# Patient Record
Sex: Male | Born: 1955 | Race: White | Hispanic: No | Marital: Married | Smoking: Never smoker
Health system: Southern US, Community
[De-identification: ages and names within clinical notes are randomized; demographics above are authoritative.]

## PROBLEM LIST (undated history)

## (undated) DIAGNOSIS — Z87442 Personal history of urinary calculi: Secondary | ICD-10-CM

## (undated) DIAGNOSIS — E663 Overweight: Secondary | ICD-10-CM

## (undated) DIAGNOSIS — E291 Testicular hypofunction: Secondary | ICD-10-CM

## (undated) DIAGNOSIS — C4491 Basal cell carcinoma of skin, unspecified: Secondary | ICD-10-CM

## (undated) DIAGNOSIS — N400 Enlarged prostate without lower urinary tract symptoms: Secondary | ICD-10-CM

## (undated) DIAGNOSIS — N529 Male erectile dysfunction, unspecified: Secondary | ICD-10-CM

## (undated) DIAGNOSIS — R361 Hematospermia: Secondary | ICD-10-CM

## (undated) DIAGNOSIS — I10 Essential (primary) hypertension: Secondary | ICD-10-CM

## (undated) DIAGNOSIS — M1990 Unspecified osteoarthritis, unspecified site: Secondary | ICD-10-CM

## (undated) HISTORY — DX: Essential (primary) hypertension: I10

## (undated) HISTORY — DX: Male erectile dysfunction, unspecified: N52.9

## (undated) HISTORY — DX: Hematospermia: R36.1

## (undated) HISTORY — DX: Benign prostatic hyperplasia without lower urinary tract symptoms: N40.0

## (undated) HISTORY — PX: VARICOCELE EXCISION: SUR582

## (undated) HISTORY — PX: TONSILLECTOMY: SUR1361

## (undated) HISTORY — DX: Testicular hypofunction: E29.1

## (undated) HISTORY — DX: Overweight: E66.3

---

## 2006-05-06 ENCOUNTER — Encounter: Admission: RE | Admit: 2006-05-06 | Discharge: 2006-05-06 | Payer: Self-pay | Admitting: Orthopedic Surgery

## 2006-05-08 ENCOUNTER — Encounter: Admission: RE | Admit: 2006-05-08 | Discharge: 2006-05-08 | Payer: Self-pay | Admitting: Orthopedic Surgery

## 2006-06-11 ENCOUNTER — Encounter: Payer: Self-pay | Admitting: Orthopedic Surgery

## 2006-06-24 ENCOUNTER — Encounter: Payer: Self-pay | Admitting: Orthopedic Surgery

## 2006-07-25 ENCOUNTER — Encounter: Payer: Self-pay | Admitting: Orthopedic Surgery

## 2007-09-25 HISTORY — PX: SHOULDER SURGERY: SHX246

## 2011-08-30 ENCOUNTER — Inpatient Hospital Stay: Payer: Self-pay | Admitting: Internal Medicine

## 2011-09-27 ENCOUNTER — Ambulatory Visit (INDEPENDENT_AMBULATORY_CARE_PROVIDER_SITE_OTHER): Payer: BC Managed Care – PPO | Admitting: Cardiovascular Disease

## 2011-09-27 ENCOUNTER — Encounter: Payer: Self-pay | Admitting: Cardiovascular Disease

## 2011-09-27 VITALS — BP 144/86 | HR 83 | Ht 70.0 in | Wt 220.0 lb

## 2011-09-27 DIAGNOSIS — R0789 Other chest pain: Secondary | ICD-10-CM | POA: Insufficient documentation

## 2011-09-27 DIAGNOSIS — Z8249 Family history of ischemic heart disease and other diseases of the circulatory system: Secondary | ICD-10-CM

## 2011-09-27 DIAGNOSIS — Z0000 Encounter for general adult medical examination without abnormal findings: Secondary | ICD-10-CM

## 2011-09-27 DIAGNOSIS — R079 Chest pain, unspecified: Secondary | ICD-10-CM | POA: Insufficient documentation

## 2011-09-27 NOTE — Assessment & Plan Note (Signed)
No further episodes of chest burning or tightness. He has been back to the gym. No further workup needed. Cholesterol is well controlled on no medications. We have suggested that if he has additional episodes, that he call our office. Options include a cardiac CT which could be done at a larger hospital. Other option is for cardiac catheter.

## 2011-09-27 NOTE — Assessment & Plan Note (Signed)
We have talked to him about his cholesterol. He does have a family history of coronary artery disease with father with bypass surgery in his 70s. We have suggested he could consider having a expanded lipid panel performed ( NMR lipoprofile), as well as a CRP and lipoprotein (a).   In terms of imaging, he could have a calcium score done if he is concerned about premature disease.

## 2011-09-27 NOTE — Assessment & Plan Note (Signed)
He is concerned that his blood pressure is mildly elevated. We've asked him to monitor his blood pressure at home. As he is a nondiabetic, ideally we would like systolic pressure less than 140.

## 2011-09-27 NOTE — Patient Instructions (Signed)
You are doing well. No medication changes were made.  Please call us if you have new issues that need to be addressed before your next appt.   Ask about NMR lipoprofile (expanded lipid panel) Ask about lipoprotein (a), CRP

## 2011-09-27 NOTE — Progress Notes (Signed)
   Patient ID: Raymond Flowers, male    DOB: [birth year], 56 y.o.   MRN: [id number]  HPI Comments: Raymond Flowers is a very pleasant 56 year old gentleman with no known coronary artery disease who presented to [facility] for chest burning on December 6. He had troponin 0.26, CK-MB 5.2. He had a treadmill stress test that showed no significant ischemia on Myoview images. He presents for further evaluation.  He reports having an echocardiogram. Echo shows normal LV function with LVH no other significant abnormalities. He exercised to a high degree on the treadmill stress test with no symptoms of reproducible chest pain.  He has been back to the gym since then and typically exercises several times per week. He has had no further chest pain since the first week in December  EKG in the hospital showed normal sinus rhythm with rate 82 beats per minute with nonspecific ST abnormality in V2 and V3 which is likely a benign finding Chest x-ray was normal,  Recent laboratories total cholesterol 150, LDL 88.9, HDL 28.7 EKG today shows normal sinus rhythm with rate 83 beats per minute with no significant ST or T wave changes. He does continue to have nonspecific ST abnormality through the anterior precordial leads   Outpatient Encounter Prescriptions as of 09/27/2011  Medication Sig Dispense Refill  . aspirin 81 MG tablet Take 81 mg by mouth daily.       . Omega-3 Fatty Acids (FISH OIL) 1000 MG CAPS Take by mouth.        . tadalafil (CIALIS) 10 MG tablet Take 10 mg by mouth daily as needed.          Review of Systems  Constitutional: Negative.   HENT: Negative.   Eyes: Negative.   Respiratory: Negative.   Cardiovascular: Negative.   Gastrointestinal: Negative.   Musculoskeletal: Negative.   Skin: Negative.   Neurological: Negative.   Hematological: Negative.   Psychiatric/Behavioral: Negative.   All other systems reviewed and are negative.    BP 144/86  Pulse 83  Ht 5\' 10"  (1.778 m)  Wt 220 lb  (99.791 kg)  BMI 31.57 kg/m2  Physical Exam  Nursing note and vitals reviewed. Constitutional: He is oriented to person, place, and time. He appears well-developed and well-nourished.  HENT:  Head: Normocephalic.  Nose: Nose normal.  Mouth/Throat: Oropharynx is clear and moist.  Eyes: Conjunctivae are normal. Pupils are equal, round, and reactive to light.  Neck: Normal range of motion. Neck supple. No JVD present.  Cardiovascular: Normal rate, regular rhythm, S1 normal, S2 normal, normal heart sounds and intact distal pulses.  Exam reveals no gallop and no friction rub.   No murmur heard. Pulmonary/Chest: Effort normal and breath sounds normal. No respiratory distress. He has no wheezes. He has no rales. He exhibits no tenderness.  Abdominal: Soft. Bowel sounds are normal. He exhibits no distension. There is no tenderness.  Musculoskeletal: Normal range of motion. He exhibits no edema and no tenderness.  Lymphadenopathy:    He has no cervical adenopathy.  Neurological: He is alert and oriented to person, place, and time. Coordination normal.  Skin: Skin is warm and dry. No rash noted. No erythema.  Psychiatric: He has a normal mood and affect. His behavior is normal. Judgment and thought content normal.           Assessment and Plan

## 2013-09-24 HISTORY — PX: OTHER SURGICAL HISTORY: SHX169

## 2013-10-05 ENCOUNTER — Other Ambulatory Visit: Payer: Self-pay

## 2014-02-26 DIAGNOSIS — R7989 Other specified abnormal findings of blood chemistry: Secondary | ICD-10-CM | POA: Insufficient documentation

## 2014-03-10 ENCOUNTER — Encounter: Payer: Self-pay | Admitting: Urology

## 2014-03-10 LAB — HEMATOCRIT: HCT: 52.4 % — AB (ref 40.0–52.0)

## 2014-03-10 LAB — HEMOGLOBIN: HGB: 17.8 g/dL (ref 13.0–18.0)

## 2014-03-24 ENCOUNTER — Encounter: Payer: Self-pay | Admitting: Urology

## 2014-06-28 ENCOUNTER — Other Ambulatory Visit: Payer: Self-pay | Admitting: Internal Medicine

## 2014-06-28 DIAGNOSIS — M5412 Radiculopathy, cervical region: Secondary | ICD-10-CM

## 2014-07-03 ENCOUNTER — Other Ambulatory Visit: Payer: BC Managed Care – PPO

## 2014-07-06 ENCOUNTER — Ambulatory Visit
Admission: RE | Admit: 2014-07-06 | Discharge: 2014-07-06 | Disposition: A | Discharge status: Home / Self Care | Payer: BC Managed Care – PPO | Source: Ambulatory Visit | Attending: Internal Medicine | Admitting: Internal Medicine

## 2014-07-06 DIAGNOSIS — M5412 Radiculopathy, cervical region: Secondary | ICD-10-CM

## 2014-09-09 ENCOUNTER — Other Ambulatory Visit: Payer: Self-pay | Admitting: Sports Medicine

## 2014-09-09 DIAGNOSIS — M25561 Pain in right knee: Secondary | ICD-10-CM

## 2014-09-12 ENCOUNTER — Ambulatory Visit
Admission: RE | Admit: 2014-09-12 | Discharge: 2014-09-12 | Disposition: A | Discharge status: Home / Self Care | Payer: BC Managed Care – PPO | Source: Ambulatory Visit | Attending: Sports Medicine | Admitting: Sports Medicine

## 2014-09-12 DIAGNOSIS — M25561 Pain in right knee: Secondary | ICD-10-CM

## 2014-09-24 HISTORY — PX: KNEE ARTHROSCOPY: SUR90

## 2015-03-10 ENCOUNTER — Other Ambulatory Visit: Payer: Self-pay | Admitting: Urology

## 2015-03-10 DIAGNOSIS — E291 Testicular hypofunction: Secondary | ICD-10-CM

## 2015-03-11 NOTE — Telephone Encounter (Signed)
Can pt have refill?  

## 2015-03-13 NOTE — Telephone Encounter (Signed)
Patient will need an office visit prior to refilling his Axiron.  Axiron is a testosterone product, therefore, will need his blood work drawn between the hours of 8 AM and 10 AM prior to his appointment. We'll need a serum testosterone, PSA and hematocrit.

## 2015-03-14 NOTE — Telephone Encounter (Signed)
Pt has an appt today 03/14/15.

## 2015-03-17 NOTE — Telephone Encounter (Signed)
Medication was sent to pt pharmacy and pt made aware. Cw,lpn

## 2015-04-04 ENCOUNTER — Ambulatory Visit: Payer: Self-pay | Admitting: Urology

## 2015-04-04 ENCOUNTER — Ambulatory Visit: Payer: BLUE CROSS/BLUE SHIELD | Attending: Orthopedic Surgery | Admitting: Physical Therapy

## 2015-04-04 ENCOUNTER — Ambulatory Visit: Payer: Self-pay | Admitting: Physical Therapy

## 2015-04-04 DIAGNOSIS — M25521 Pain in right elbow: Secondary | ICD-10-CM

## 2015-04-04 DIAGNOSIS — M25511 Pain in right shoulder: Secondary | ICD-10-CM | POA: Diagnosis present

## 2015-04-04 DIAGNOSIS — M12811 Other specific arthropathies, not elsewhere classified, right shoulder: Secondary | ICD-10-CM

## 2015-04-04 DIAGNOSIS — M1990 Unspecified osteoarthritis, unspecified site: Secondary | ICD-10-CM | POA: Diagnosis not present

## 2015-04-04 NOTE — Patient Instructions (Signed)
All exercises provided were adapted from [email or web address]. Patient was provided a written handout with pictures as described. Any additional cues were manually entered in to handout and copied in to this document.   PENDULUM SHOULDER FORWARD/BACK (20 seconds)   Shift your body weight forward then back to allow your injured arm to swing forward and back freely. Your injured arm should be fully relaxed.    ** Can do this daily**   Shoulder isometric-internal rotation  (10 times, 5 second hold)  Hold your arm against your side, with your elbow at a 90 degree angle. Without actually moving your arm, push your hand into the wall. You should feel your shoulder muscles contract. Repeat contract and relax motion.    ** Repeat every other day**    Shoulder isometric-flexion (10 times, 5 second hold)  Hold your arm against your side, with your elbow at a 90 degree angle. Without moving your body, push your fist straight into the wall ahead of you. You should feel your shoulder muscles contract. Repeat contract and relax motion.     ** Repeat every other day**     Shoulder isometric-extension (10 times, 5 second hold)  Hold your arm against your side, with your elbow at a 90 degree angle. Without moving your body, push your arm back into the wall. You should feel your shoulder muscles contract. Repeat contract and relax motion.     ** Repeat every other day**    Shoulder isometric-external rotation (10 times, 5 second hold)  Hold your arm against your side, with your elbow at a 90 degree angle. Without moving your body, push the back of your hand into the wall. You should feel your shoulder muscles contract. Repeat contract and relax motion.    ** Repeat every other day**    SCAPULAR RETRACTIONS (10 times, 2x per day)  Draw your shoulder blades back and down.

## 2015-04-05 NOTE — Therapy (Signed)
[facility] [facility] [address], [place], [address] Phone: [phone]   Fax:  [phone]  Physical Therapy Evaluation  Patient Details  Name: Jodi Kappes MRN: [id number] Date of Birth: 03-Aug-1956 Referring Provider:  Justice Britain, MD  Encounter Date: 04/04/2015      PT End of Session - 04/04/15 1649    Visit Number 1   Number of Visits 17   Date for PT Re-Evaluation 06/13/15   PT Start Time 7858   PT Stop Time 1625   PT Time Calculation (min) 62 min   Activity Tolerance Patient tolerated treatment well;Patient limited by pain   Behavior During Therapy [facility] for tasks assessed/performed      Past Medical History  Diagnosis Date  . Erectile dysfunction     Past Surgical History  Procedure Laterality Date  . Shoulder surgery      right shoulder  . Varicocele excision  8  . Tonsillectomy      There were no vitals filed for this visit.  Visit Diagnosis:  Pain in joint, upper arm, right - Plan: PT plan of care cert/re-cert  Rotator cuff arthropathy, right - Plan: PT plan of care cert/re-cert      Subjective Assessment - 04/04/15 1531    Subjective Patient presents 6 days post-op right RTC for a "full thickness tear". He has had a previous operation on the RUE to remove a bone spur on his acromion. He reports no pain today, and presents with sling donned in IR and adduction.    Pertinent History When patient was 59 years old he sustained a serious injury which injured his scapulae and his pectoral muscle on his RUE. He rehabbed this himself and felt good until he began feeling a cutting sensation in his RUE (which is when he had the acromion resected). Most recently he was lifting weights and while doing a combined shoulder press to eccentrically controlling a lateral raise he felt a snapping sensation in his RUE and underwent this procedure.    Limitations Lifting   How long can you sit  comfortably? Patient works at [employer].    Patient Stated Goals To return to his outdoor hobbies (hunting and fishing).    Currently in Pain? No/denies            [facility] PT Assessment - 04/05/15 0001    Assessment   Medical Diagnosis Right RTC repair   Onset Date/Surgical Date 03/29/15   Hand Dominance Right   Precautions   Precautions Shoulder   Type of Shoulder Precautions No lifting, in sling    Shoulder Interventions Shoulder sling/immobilizer;At all times   Precaution Booklet Issued Yes (comment)   Precaution Comments Sling initially in IR and adduction, educated patient to bring his shoulder into more neutral position.    Restrictions   Weight Bearing Restrictions Yes   Other Position/Activity Restrictions No lifting, keep in sling.    Prior Function   Level of Independence Independent   Vocation Full time employment   [employer] work.    Leisure Fishing, hunting, lifting [employer]   Cognition   Overall Cognitive Status Within Functional Limits for tasks assessed   Observation/Other Assessments   Skin Integrity Significant bruising around his right bicipital region.    Quick DASH  52.3%  12 on work module, 16 on sports module   Posture/Postural Control   Posture Comments Rounded shoulders and stands with limited thoracic flexion.  PROM   Overall PROM Comments Full IR PROM in no abduction, ~90 degrees in PT assisted PROM, ~110 degrees with wand in passive ROM flexion, adduction limited by 50-75%       There-Ex  IR/ER Wand exercises at 0 degrees of abduction x 12 AAROM Wand flexion in supine x 10 Isometric shoulder flexion x10 Isometric shoulder extension x 10 Isometric shoulder ER x 10 Isometric shoulder IR x 10  Ball squeezes x 10 Active ROM x 10 elbow flexion/extension.                     PT Education - 04/04/15 1648    Education provided Yes   Education Details Patient provided with JOSPT article on post-RTC repair protocol  and precautions.    Person(s) Educated Patient   Methods Explanation;Demonstration;Handout   Comprehension Verbalized understanding;Returned demonstration          PT Short Term Goals - 04/05/15 1652    PT SHORT TERM GOAL #1   Title Patient will display full PROM with a VAS score of less than 3/10 to demonstrate ability to participate in [facility] for functional activities.            PT Long Term Goals - 04/05/15 1646    PT LONG TERM GOAL #1   Title Patient will be independent with home exercise program to increase his strength, mobility, and function of RUE by 06/13/2015   Status New   PT LONG TERM GOAL #2   Title Patient will report a QuickDash score of less than 42% to indicate a clinically significant improvement in self reported function by 06/13/2015   Baseline 52.3% disability   Status New   PT LONG TERM GOAL #3   Title Patient will display full active ROM WFL of RUE to demonstrate increased function by 06/13/2015.    Baseline No AROM per protocl.    Status New   PT LONG TERM GOAL #4   Title Patient will participate in recreational hobbies such as hunting and fishing with no increase in symptoms to return to functional activities by 06/13/2015.   Baseline Unable to actively use RUE.    PT LONG TERM GOAL #5   Title Patient will be able to lift at least 10 pounds to demonstrate ability to return to ADLs by 06/13/2015.    Baseline Unable to lift.                Plan - 04/04/15 1651    Clinical Impression Statement Patient is a 59 y/o male that is s/p RTC repair on his RUE on July 5th, 2016. Patient has a previous history of scapular injury, pectoral injury, and previous acromion resection. Patient notes he has had pain in his bicipital groove for some time, and presents with significant bruising around his right biceps (he states this is likely from his compression device). Patient tolerates PROM (shoulder flexion) in supine to roughly 90 degrees initially, and is able to  complete ~110 degrees of shoulder flexion with AAROM with PVC pipe after soft tissue mobilization provided to bicipital region. Patient is able to perform full IR ROM with wand at 0 degrees of abduction, and ~50% ER ROM at the same. Patient had no increase in pain with exercises provided. Patient would benefit from skilled PT services to address his RUE mobility and strength deficits and progress appropriately throughout his rehabilitaiton.     Pt will benefit from skilled therapeutic intervention in order to improve on the  following deficits Pain;Decreased strength;Impaired UE functional use   Rehab Potential Good   Clinical Impairments Affecting Rehab Potential Second surgery on his RUE with a history of trauma to the area.    PT Frequency 2x / week   PT Duration 8 weeks   PT Treatment/Interventions Manual techniques;Iontophoresis 4mg /ml Dexamethasone;Moist Heat;Therapeutic exercise;Therapeutic activities;Taping;Dry needling;Passive range of motion;Ultrasound;Biofeedback;Electrical Stimulation;Cryotherapy   PT Next Visit Plan Progress with ROM and strengthening per JOSPT protocol.    PT Home Exercise Plan See patient instructions.    Consulted and Agree with Plan of Care Patient         Problem List Patient Active Problem List   Diagnosis Date Noted  . Family history of early CAD 09/27/2011  . Chest pain 09/27/2011  . Visit for preventive health examination 09/27/2011    Kerman Passey, PT, DPT    04/05/2015, 4:59 PM  [place] [facility] [address], [place], [address] Phone: [phone]   Fax:  [phone]

## 2015-04-06 ENCOUNTER — Encounter: Payer: Self-pay | Admitting: *Deleted

## 2015-04-06 ENCOUNTER — Other Ambulatory Visit: Payer: Self-pay | Admitting: *Deleted

## 2015-04-07 ENCOUNTER — Encounter: Payer: Self-pay | Admitting: Physical Therapy

## 2015-04-07 ENCOUNTER — Ambulatory Visit: Payer: BLUE CROSS/BLUE SHIELD | Admitting: Physical Therapy

## 2015-04-07 DIAGNOSIS — M25511 Pain in right shoulder: Secondary | ICD-10-CM | POA: Diagnosis not present

## 2015-04-07 DIAGNOSIS — M12811 Other specific arthropathies, not elsewhere classified, right shoulder: Secondary | ICD-10-CM

## 2015-04-07 NOTE — Therapy (Signed)
[place] [facility] [address] [place], [address] Phone: [phone]   Fax:  [phone]  Physical Therapy Treatment  Patient Details  Name: Raymond Flowers MRN: [id number] Date of Birth: [birth year] Referring Provider:  Justice Britain, MD  Encounter Date: 04/07/2015      PT End of Session - 04/07/15 1659    Visit Number 2   PT Start Time 1600   PT Stop Time 7793   PT Time Calculation (min) 45 min      Past Medical History  Diagnosis Date  . Erectile dysfunction   . Blood in semen   . Over weight   . Hypertension   . Benign enlargement of prostate   . Androgen deficiency   . BPH (benign prostatic hyperplasia)   . Impotence     Past Surgical History  Procedure Laterality Date  . Shoulder surgery  2009    right shoulder  . Varicocele excision  8  . Tonsillectomy      There were no vitals filed for this visit.  Visit Diagnosis:  Rotator cuff arthropathy, right      Subjective Assessment - 04/07/15 1604    Subjective Pt reports he has seen MD for followup, MD was unhappy with his resisted motion exercises which were done with previous PT. Reemphasized only PROM for the first 4 weeks. Reports no pain and he slept well last night, possibly due to taking valium         Objective: PROM in supine for shoulder flexion, ER, IR, abduction.  Pt had significant difficulty relaxing, attempted floppy fish mob, passive elbow flexion extension in order to address this. Pt required extensive cuing, verbal tactile in order to relax. Unable to achieve full passive ROM due to muscle guarding. Pain only when pt muscle guarded and improved considerably when pt able to relax.                        PT Education - 04/07/15 1657    Education provided Yes   Education Details Pt educated on protocol per MD.   Terence Lux) Educated Patient   Methods Explanation   Comprehension Verbalized  understanding          PT Short Term Goals - 04/05/15 1652    PT SHORT TERM GOAL #1   Title Patient will display full PROM with a VAS score of less than 3/10 to demonstrate ability to participate in [facility] for functional activities.            PT Long Term Goals - 04/05/15 1646    PT LONG TERM GOAL #1   Title Patient will be independent with home exercise program to increase his strength, mobility, and function of RUE by 06/13/2015   Status New   PT LONG TERM GOAL #2   Title Patient will report a QuickDash score of less than 42% to indicate a clinically significant improvement in self reported function by 06/13/2015   Baseline 52.3% disability   Status New   PT LONG TERM GOAL #3   Title Patient will display full active ROM WFL of RUE to demonstrate increased function by 06/13/2015.    Baseline No AROM per protocl.    Status New   PT LONG TERM GOAL #4   Title Patient will participate in recreational hobbies such as hunting and fishing with no increase in symptoms to return to functional activities by 06/13/2015.  Baseline Unable to actively use RUE.    PT LONG TERM GOAL #5   Title Patient will be able to lift at least 10 pounds to demonstrate ability to return to ADLs by 06/13/2015.    Baseline Unable to lift.                Plan - 04/07/15 1701    Clinical Impression Statement Pt has significant difficulty with maintaining relaxation when performing PROM stretches. Progressed and discussed progression per protocol and pt verbalized understanding of this.        Problem List Patient Active Problem List   Diagnosis Date Noted  . Family history of early CAD 09/27/2011  . Chest pain 09/27/2011  . Visit for preventive health examination 09/27/2011    [name],[name] 04/07/2015, 5:10 PM  [place] [facility] [address] [place], [address] Phone: [phone]   Fax:  [phone]

## 2015-04-11 ENCOUNTER — Ambulatory Visit: Payer: Self-pay | Admitting: Physical Therapy

## 2015-04-11 ENCOUNTER — Encounter: Payer: Self-pay | Admitting: Physical Therapy

## 2015-04-12 ENCOUNTER — Encounter: Payer: Self-pay | Admitting: Urology

## 2015-04-12 ENCOUNTER — Ambulatory Visit (INDEPENDENT_AMBULATORY_CARE_PROVIDER_SITE_OTHER): Payer: BLUE CROSS/BLUE SHIELD | Admitting: Urology

## 2015-04-12 VITALS — BP 150/89 | HR 60 | Ht 70.0 in | Wt 223.1 lb

## 2015-04-12 DIAGNOSIS — N529 Male erectile dysfunction, unspecified: Secondary | ICD-10-CM | POA: Diagnosis not present

## 2015-04-12 DIAGNOSIS — N401 Enlarged prostate with lower urinary tract symptoms: Secondary | ICD-10-CM

## 2015-04-12 DIAGNOSIS — E291 Testicular hypofunction: Secondary | ICD-10-CM | POA: Diagnosis not present

## 2015-04-12 DIAGNOSIS — N138 Other obstructive and reflux uropathy: Secondary | ICD-10-CM

## 2015-04-12 LAB — BLADDER SCAN AMB NON-IMAGING: Scan Result: 103

## 2015-04-12 NOTE — Progress Notes (Addendum)
04/12/2015 11:24 AM   Raymond Flowers Feb 25, 1956 [id number]  Referring provider: Rusty Aus, MD [place], [place] [address]  Chief Complaint  Patient presents with  . Benign Prostatic Hypertrophy    6 month recheck  . Erectile Dysfunction    HPI: Raymond Flowers is a 59 year old white male with erectile dysfunction, hypogonadism and BPH with LUTS who presents today for 6 month follow-up.  He is currently using Axiron one swipe 4 times weekly. He just restarted the medication last week. He has filled out and ADAM questionnaire today.      Androgen Deficiency in the Aging Male      04/12/15 1000       Androgen Deficiency in the Aging Male   Do you have a decrease in libido (sex drive) Yes     Do you have lack of energy No     Do you have a decrease in strength and/or endurance No     Have you lost height No     Have you noticed a decreased "enjoyment of life" No     Are you sad and/or grumpy No     Are your erections less strong Yes     Have you noticed a recent deterioration in your ability to play sports No     Are you falling asleep after dinner Yes     Has there been a recent deterioration in your work performance No        His erectile dysfunction is well managed with Cialis 20 mg on-demand dosing. His SHIM score today is 20, which is mild erectile dysfunction. He denies any penile curvature or pain with erections.      SHIM      04/12/15 1050       SHIM: Over the last 6 months:   How do you rate your confidence that you could get and keep an erection? Moderate     When you had erections with sexual stimulation, how often were your erections hard enough for penetration (entering your partner)? Most Times (much more than half the time)     During sexual intercourse, how often were you able to maintain your erection after you had penetrated (entered) your partner? Slightly Difficult     During sexual intercourse, how difficult was it to  maintain your erection to completion of intercourse? Slightly Difficult     When you attempted sexual intercourse, how often was it satisfactory for you? Not Difficult     SHIM Total Score   SHIM 20        Score: 1-7 Severe ED 8-11 Moderate ED 12-16 Mild-Moderate ED 17-21 Mild ED 22-25 No ED  His IPS S score today is 6, which is mild lower urinary tract symptomatology. He is pleased with quality of life due to his urinary symptoms.  His PVR is 103 mL today. He is not experiencing dysuria, gross hematuria or suprapubic pain. He also denies any recent fevers, chills, nausea, vomiting or urinary tract infections.      IPSS      04/12/15 1000       International Prostate Symptom Score   How often have you had the sensation of not emptying your bladder? Not at All     How often have you had to urinate less than every two hours? About half the time     How often have you found you stopped and started again several times when you urinated? Not  at All     How often have you found it difficult to postpone urination? Not at All     How often have you had a weak urinary stream? Not at All     How often have you had to strain to start urination? Not at All     How many times did you typically get up at night to urinate? 3 Times     Total IPSS Score 6     Quality of Life due to urinary symptoms   If you were to spend the rest of your life with your urinary condition just the way it is now how would you feel about that? Pleased        Score:  1-7 Mild 8-19 Moderate 20-35 Severe  PMH: Past Medical History  Diagnosis Date  . Erectile dysfunction   . Blood in semen   . Over weight   . Hypertension   . Benign enlargement of prostate   . Androgen deficiency   . BPH (benign prostatic hyperplasia)   . Impotence     Surgical History: Past Surgical History  Procedure Laterality Date  . Shoulder surgery  2009    right  shoulder x 2  (20016)  . Varicocele excision  8  .  Tonsillectomy    . Knee arthroscopy Right 2016  . Tricep surgery Left 2015    Home Medications:    Medication List       This list is accurate as of: 04/12/15 11:24 AM.  Always use your most recent med list.               ALPRAZolam 0.25 MG tablet  Commonly known as:  XANAX  Take 0.25 mg by mouth at bedtime as needed for anxiety.     aspirin 81 MG tablet  Take 81 mg by mouth daily.     AXIRON 30 MG/ACT Soln  Generic drug:  Testosterone  APPLY 1 PUMP TO EACH ARMPIT EVERY DAY     cyclobenzaprine 10 MG tablet  Commonly known as:  FLEXERIL  Take by mouth.     diazepam 5 MG tablet  Commonly known as:  VALIUM  Take 5 mg by mouth every 6 (six) hours as needed for anxiety.     Fish Oil 1000 MG Caps  Take by mouth.     GREEN TEA PO  Take by mouth.     hydrochlorothiazide 25 MG tablet  Commonly known as:  HYDRODIURIL  TK 1 T PO QD     naproxen 500 MG tablet  Commonly known as:  NAPROSYN  TK 1 T PO BID PRN     RA NAPROXEN SODIUM 220 MG tablet  Generic drug:  naproxen sodium  Take by mouth.     sildenafil 100 MG tablet  Commonly known as:  VIAGRA  Take by mouth.     tadalafil 10 MG tablet  Commonly known as:  CIALIS  Take 10 mg by mouth daily as needed.        Allergies:  Allergies  Allergen Reactions  . Codeine     Hallucinations     Family History: Family History  Problem Relation Age of Onset  . Heart disease Mother 20  . Hypertension Mother   . Heart disease Father 87    MI   . Hypertension Brother   . Prostate cancer Neg Hx   . Kidney disease Neg Hx   . Kidney Stones Father     mother  Social History:  reports that he has never smoked. He does not have any smokeless tobacco history on file. He reports that he drinks about 1.2 oz of alcohol per week. He reports that he does not use illicit drugs.  ROS: UROLOGY Frequent Urination?: No Hard to postpone urination?: No Burning/pain with urination?: No Get up at night to urinate?:  No Leakage of urine?: No Urine stream starts and stops?: No Trouble starting stream?: No Do you have to strain to urinate?: No Blood in urine?: No Urinary tract infection?: No Sexually transmitted disease?: No Injury to kidneys or bladder?: No Painful intercourse?: No Weak stream?: No Erection problems?: Yes Penile pain?: No  Gastrointestinal Nausea?: No Vomiting?: No Indigestion/heartburn?: No Diarrhea?: No Constipation?: No  Constitutional Fever: No Night sweats?: No Weight loss?: No Fatigue?: No  Skin Skin rash/lesions?: No Itching?: No  Eyes Blurred vision?: No Double vision?: No  Ears/Nose/Throat Sore throat?: No Sinus problems?: No  Hematologic/Lymphatic Swollen glands?: No Easy bruising?: No  Cardiovascular Leg swelling?: No Chest pain?: No  Respiratory Cough?: No Shortness of breath?: No  Endocrine Excessive thirst?: No  Musculoskeletal Back pain?: No Joint pain?: No  Neurological Headaches?: No Dizziness?: No  Psychologic Depression?: No Anxiety?: No  Physical Exam: BP 150/89 mmHg  Pulse 60  Ht 5\' 10"  (1.778 m)  Wt 223 lb 1.6 oz (101.197 kg)  BMI 32.01 kg/m2  GU: Patient with circumcised phallus.   Urethral meatus is patent.  No penile discharge. No penile lesions or rashes. Scrotum without lesions, cysts, rashes and/or edema.  Testicles are located scrotally bilaterally. No masses are appreciated in the testicles. Left and right epididymis are normal. Rectal: Patient with  normal sphincter tone. Perineum without scarring or rashes. No rectal masses are appreciated. Prostate is approximately 50 grams, no nodules are appreciated. Seminal vesicles are normal.   Laboratory Data: Results for orders placed or performed in visit on 04/12/15  BLADDER SCAN AMB NON-IMAGING  Result Value Ref Range   Scan Result 103    Lab Results  Component Value Date   HGB 17.8 03/10/2014   HCT 52.4* 03/10/2014    No results found for:  CREATININE  No results found for: PSA  No results found for: TESTOSTERONE  No results found for: HGBA1C  Urinalysis No results found for: COLORURINE, APPEARANCEUR, LABSPEC, PHURINE, GLUCOSEU, HGBUR, BILIRUBINUR, KETONESUR, PROTEINUR, UROBILINOGEN, NITRITE, LEUKOCYTESUR  Pertinent Imaging:   Assessment & Plan:    1. BPH (benign prostatic hyperplasia) with LUTS:   Patient's postvoid residual is 103 mL at today's visit.  He is not experiencing any suprapubic pain and he is quite pleased with his urinary symptoms at this time. We'll continue to monitor closely with biannual IPSS, PVR, DRE and PSA's.  PSA history:    1.1 ng/mL on 09/03/2013    1.2 ng/mL on 03/04/2014    1.2 ng/mL on 10/04/2014  - PSA - BLADDER SCAN AMB NON-IMAGING  2. Erectile dysfunction, unspecified erectile dysfunction type:   Patient's SH IM score indicates a mild erectile dysfunction. It is responding well to Cialis 20 mg on-demand dosing. He will continue that medication. We will repeat SH IM score in 6 months.  3. Hypogonadism:   Patient is having good results with Axiron 1 swipe 4 days weekly.  He will return in the morning for a serum testosterone draw.  If testosterone is in therapeutic levels, he will return in 6 months time for ADAM questionnaire, a morning testosterone draw and hematocrit.  - Hematocrit  No Follow-up on file.  Zara Council, [facility] [address] [place], [place] [address] [phone]

## 2015-04-13 ENCOUNTER — Encounter: Payer: Self-pay | Admitting: Physical Therapy

## 2015-04-13 ENCOUNTER — Ambulatory Visit: Payer: BLUE CROSS/BLUE SHIELD | Admitting: Physical Therapy

## 2015-04-13 DIAGNOSIS — M25521 Pain in right elbow: Secondary | ICD-10-CM

## 2015-04-13 DIAGNOSIS — M12811 Other specific arthropathies, not elsewhere classified, right shoulder: Secondary | ICD-10-CM

## 2015-04-13 DIAGNOSIS — M25511 Pain in right shoulder: Secondary | ICD-10-CM | POA: Diagnosis not present

## 2015-04-13 LAB — PSA: Prostate Specific Ag, Serum: 1.6 ng/mL (ref 0.0–4.0)

## 2015-04-13 LAB — HEMATOCRIT: Hematocrit: 48.1 % (ref 37.5–51.0)

## 2015-04-13 NOTE — Therapy (Signed)
[facility] [address], [place], [address] Phone: [phone]   Fax:  [phone]  Physical Therapy Treatment  Patient Details  Name: Raymond Flowers MRN: [id number] Date of Birth: 04-28-1956 Referring Provider:  Justice Britain, MD  Encounter Date: 04/13/2015      PT End of Session - 04/13/15 0926    Visit Number 3   Number of Visits 17   PT Start Time 0815   PT Stop Time 0845   PT Time Calculation (min) 30 min   Activity Tolerance Patient tolerated treatment well;Patient limited by pain      Past Medical History  Diagnosis Date  . Erectile dysfunction   . Blood in semen   . Over weight   . Hypertension   . Benign enlargement of prostate   . Androgen deficiency   . BPH (benign prostatic hyperplasia)   . Impotence     Past Surgical History  Procedure Laterality Date  . Shoulder surgery  2009    right  shoulder x 2  (20016)  . Varicocele excision  8  . Tonsillectomy    . Knee arthroscopy Right 2016  . Tricep surgery Left 2015    There were no vitals filed for this visit.  Visit Diagnosis:  Rotator cuff arthropathy, right  Pain in joint, upper arm, right      Subjective Assessment - 04/13/15 0924    Subjective Pt has been sleepign through the night, has been working on relaxation.                Objective: PROM in all directions in supine for shoulder motion, Notable improvement in ROM (110 degr. Flexion, 90 degr. Abduction, 15 degr. ER/IR. Pt initially had difficulty relaxing so performed the following there ex, following which pt had improvement in performance:  Lifting L UE to feel back engagement.  Attempting to engage back when imagining raising R UE.  Wt shifting to force engagement of back while imagining raising R UE.  Overall pt did well in session and demonsrated vastly improved ROM with improved relaxation.                   PT Short Term  Goals - 04/05/15 1652    PT SHORT TERM GOAL #1   Title Patient will display full PROM with a VAS score of less than 3/10 to demonstrate ability to participate in [facility] for functional activities.            PT Long Term Goals - 04/05/15 1646    PT LONG TERM GOAL #1   Title Patient will be independent with home exercise program to increase his strength, mobility, and function of RUE by 06/13/2015   Status New   PT LONG TERM GOAL #2   Title Patient will report a QuickDash score of less than 42% to indicate a clinically significant improvement in self reported function by 06/13/2015   Baseline 52.3% disability   Status New   PT LONG TERM GOAL #3   Title Patient will display full active ROM WFL of RUE to demonstrate increased function by 06/13/2015.    Baseline No AROM per protocl.    Status New   PT LONG TERM GOAL #4   Title Patient will participate in recreational hobbies such as hunting and fishing with no increase in symptoms to return to functional activities by 06/13/2015.   Baseline Unable to actively use RUE.  PT LONG TERM GOAL #5   Title Patient will be able to lift at least 10 pounds to demonstrate ability to return to ADLs by 06/13/2015.    Baseline Unable to lift.                Plan - 04/13/15 0926    Clinical Impression Statement Pt demonstrated improvement in relaxation when performing stretches. Able to perform prestabilization strategy activity.        Problem List Patient Active Problem List   Diagnosis Date Noted  . BPH with obstruction/lower urinary tract symptoms 04/12/2015  . Erectile dysfunction 04/12/2015  . Hypogonadism in male 04/12/2015  . Family history of early CAD 09/27/2011  . Chest pain 09/27/2011  . Visit for preventive health examination 09/27/2011    [name],[name] 04/13/2015, 9:30 AM  [facility] [address] [place], [address] Phone: [phone]   Fax:   [phone]

## 2015-04-14 ENCOUNTER — Telehealth: Payer: Self-pay

## 2015-04-14 ENCOUNTER — Ambulatory Visit: Payer: BLUE CROSS/BLUE SHIELD | Admitting: Physical Therapy

## 2015-04-14 DIAGNOSIS — M25511 Pain in right shoulder: Secondary | ICD-10-CM | POA: Diagnosis not present

## 2015-04-14 DIAGNOSIS — M12811 Other specific arthropathies, not elsewhere classified, right shoulder: Secondary | ICD-10-CM

## 2015-04-14 NOTE — Therapy (Signed)
[facility] [address], [place], [address] Phone: [phone]   Fax:  [phone]  Physical Therapy Treatment  Patient Details  Name: Raymond Flowers MRN: [id number] Date of Birth: 11/14/1955 Referring Provider:  Justice Britain, MD  Encounter Date: 04/14/2015      PT End of Session - 04/14/15 0901    Visit Number 4   Number of Visits 17   PT Start Time 0815   PT Stop Time 0900   PT Time Calculation (min) 45 min   Activity Tolerance Patient tolerated treatment well;Patient limited by pain   Behavior During Therapy [facility] for tasks assessed/performed      Past Medical History  Diagnosis Date  . Erectile dysfunction   . Blood in semen   . Over weight   . Hypertension   . Benign enlargement of prostate   . Androgen deficiency   . BPH (benign prostatic hyperplasia)   . Impotence     Past Surgical History  Procedure Laterality Date  . Shoulder surgery  2009    right  shoulder x 2  ([address]16)  . Varicocele excision  8  . Tonsillectomy    . Knee arthroscopy Right 2016  . Tricep surgery Left 2015    There were no vitals filed for this visit.  Visit Diagnosis:  Rotator cuff arthropathy, right      Subjective Assessment - 04/14/15 0820    Subjective Pt reports no pain, he has noticed that his arm moves at night when he is sleeping, but he reports on pain with this.   Currently in Pain? No/denies          Objective: Per protocol, performed PROM stretching in pain free range for shoulder flex, abd, ER/IR. Cuing throughout for breathing and relaxation to improve tolerance for stretching.  Scapular mobs in 6 directions, deferred tipping on horizontal axis.  Scar massage and instruction in self scar massage for incision site, incisions are C/D/I.  Pt is tolerating stretches well.                        PT Education - 04/14/15 0900    Education provided Yes   Education  Details educated on performing self scar massage.   Person(s) Educated Patient   Methods Explanation;Demonstration   Comprehension Verbalized understanding;Returned demonstration          PT Short Term Goals - 04/05/15 1652    PT SHORT TERM GOAL #1   Title Patient will display full PROM with a VAS score of less than 3/10 to demonstrate ability to participate in [facility] for functional activities.            PT Long Term Goals - 04/05/15 1646    PT LONG TERM GOAL #1   Title Patient will be independent with home exercise program to increase his strength, mobility, and function of RUE by 06/13/2015   Status New   PT LONG TERM GOAL #2   Title Patient will report a QuickDash score of less than 42% to indicate a clinically significant improvement in self reported function by 06/13/2015   Baseline 52.3% disability   Status New   PT LONG TERM GOAL #3   Title Patient will display full active ROM WFL of RUE to demonstrate increased function by 06/13/2015.    Baseline No AROM per protocl.    Status New   PT LONG TERM GOAL #4  Title Patient will participate in recreational hobbies such as hunting and fishing with no increase in symptoms to return to functional activities by 06/13/2015.   Baseline Unable to actively use RUE.    PT LONG TERM GOAL #5   Title Patient will be able to lift at least 10 pounds to demonstrate ability to return to ADLs by 06/13/2015.    Baseline Unable to lift.                Plan - 04/14/15 0901    Clinical Impression Statement shoulder PROM still pain limited with flexion, abduction is improved to 110 with c/o pain at end range. Pt understands the need for continued vigilance in avoiding excessive movement of shoulder.        Problem List Patient Active Problem List   Diagnosis Date Noted  . BPH with obstruction/lower urinary tract symptoms 04/12/2015  . Erectile dysfunction 04/12/2015  . Hypogonadism in male 04/12/2015  . Family history of early CAD  09/27/2011  . Chest pain 09/27/2011  . Visit for preventive health examination 09/27/2011    Emmajean Ratledge 04/14/2015, 9:07 AM  [facility] [address] [place], [address] Phone: [phone]   Fax:  [phone]

## 2015-04-14 NOTE — Telephone Encounter (Signed)
Spoke with pt and made aware of lab results. Pt will return to office 04/15/15 for testosterone lab draw.

## 2015-04-14 NOTE — Telephone Encounter (Signed)
-----   Message from Nori Riis, PA-C sent at 04/13/2015  6:11 PM EDT ----- Patient's PSA and hematocrit are stable. I'm still waiting for his morning serum testosterone results.

## 2015-04-15 ENCOUNTER — Other Ambulatory Visit: Payer: BLUE CROSS/BLUE SHIELD

## 2015-04-15 DIAGNOSIS — E291 Testicular hypofunction: Secondary | ICD-10-CM

## 2015-04-16 LAB — TESTOSTERONE: Testosterone: 852 ng/dL (ref 348–1197)

## 2015-04-18 ENCOUNTER — Encounter: Payer: Self-pay | Admitting: Physical Therapy

## 2015-04-18 ENCOUNTER — Telehealth: Payer: Self-pay

## 2015-04-18 NOTE — Telephone Encounter (Signed)
-----   Message from Nori Riis, PA-C sent at 04/18/2015  8:21 AM EDT ----- Patient's testosterone is at a therapeutic level.  We will see him in 6 months. Marland Kitchen

## 2015-04-18 NOTE — Telephone Encounter (Signed)
Spoke with pt wife in reference to lab results. Wife voiced understanding.  

## 2015-04-19 ENCOUNTER — Ambulatory Visit: Payer: BLUE CROSS/BLUE SHIELD | Admitting: Physical Therapy

## 2015-04-19 DIAGNOSIS — M25511 Pain in right shoulder: Secondary | ICD-10-CM | POA: Diagnosis not present

## 2015-04-19 DIAGNOSIS — M25521 Pain in right elbow: Secondary | ICD-10-CM

## 2015-04-19 DIAGNOSIS — M12811 Other specific arthropathies, not elsewhere classified, right shoulder: Secondary | ICD-10-CM

## 2015-04-19 NOTE — Therapy (Signed)
[facility] [address] [place], [address] Phone: [phone]   Fax:  [phone]  Physical Therapy Treatment  Patient Details  Name: Raymond Flowers MRN: [id number] Date of Birth: 1956-01-28 Referring Provider:  Justice Britain, MD  Encounter Date: 04/19/2015      PT End of Session - 04/19/15 1350    Visit Number 5   Number of Visits 17   Date for PT Re-Evaluation 06/13/15   PT Start Time 1300   PT Stop Time 6720   PT Time Calculation (min) 45 min   Activity Tolerance No increased pain;Patient tolerated treatment well   Behavior During Therapy [facility] for tasks assessed/performed      Past Medical History  Diagnosis Date  . Erectile dysfunction   . Blood in semen   . Over weight   . Hypertension   . Benign enlargement of prostate   . Androgen deficiency   . BPH (benign prostatic hyperplasia)   . Impotence     Past Surgical History  Procedure Laterality Date  . Shoulder surgery  2009    right  shoulder x 2  ([address]16)  . Varicocele excision  8  . Tonsillectomy    . Knee arthroscopy Right 2016  . Tricep surgery Left 2015    There were no vitals filed for this visit.  Visit Diagnosis:  Rotator cuff arthropathy, right  Pain in joint, upper arm, right      Subjective Assessment - 04/19/15 1348    Subjective Patient reports really no pain, has been using arm some, denies overhead use and denies lifting. Reports his protocol states PROM for 4 weeks.    Pertinent History When patient was 59 years old he sustained a serious injury which injured his scapulae and his pectoral muscle on his RUE. He rehabbed this himself and felt good until he began feeling a cutting sensation in his RUE (which is when he had the acromion resected). Most recently he was lifting weights and while doing a combined shoulder press to eccentrically controlling a lateral raise he felt a snapping sensation in his RUE and  underwent this procedure.    Limitations Lifting   How long can you sit comfortably? Patient works at [employer].    Patient Stated Goals To return to his outdoor hobbies (hunting and fishing).    Currently in Pain? No/denies   Multiple Pain Sites No       Treatment to include: PROM in right shoulder extension, abduction, IR/ER to pain tolerance, nearing full rom.  Joint mobs PA, distally grade II x 5 bouts each, gentle distraction. Gentle oscillation of humerus for relaxation.            PT Education - 04/19/15 1350    Education provided Yes   Education Details reiterated scar massage, no lifting, no overhead activities.    Person(s) Educated Patient   Methods Explanation;Demonstration          PT Short Term Goals - 04/05/15 1652    PT SHORT TERM GOAL #1   Title Patient will display full PROM with a VAS score of less than 3/10 to demonstrate ability to participate in [facility] for functional activities.            PT Long Term Goals - 04/05/15 1646    PT LONG TERM GOAL #1   Title Patient will be independent with home exercise program to increase his strength, mobility, and function of  RUE by 06/13/2015   Status New   PT LONG TERM GOAL #2   Title Patient will report a QuickDash score of less than 42% to indicate a clinically significant improvement in self reported function by 06/13/2015   Baseline 52.3% disability   Status New   PT LONG TERM GOAL #3   Title Patient will display full active ROM WFL of RUE to demonstrate increased function by 06/13/2015.    Baseline No AROM per protocl.    Status New   PT LONG TERM GOAL #4   Title Patient will participate in recreational hobbies such as hunting and fishing with no increase in symptoms to return to functional activities by 06/13/2015.   Baseline Unable to actively use RUE.    PT LONG TERM GOAL #5   Title Patient will be able to lift at least 10 pounds to demonstrate ability to return to ADLs by 06/13/2015.    Baseline  Unable to lift.                Plan - 04/19/15 1351    Clinical Impression Statement Patient tolerates PROM well. Resists some mobility, but is nearing full flexion and abdcution rom. No pain reported with rom, just some pressure.    Pt will benefit from skilled therapeutic intervention in order to improve on the following deficits Pain;Decreased strength;Impaired UE functional use   Rehab Potential Good   Clinical Impairments Affecting Rehab Potential Second surgery on his RUE with a history of trauma to the area.    PT Frequency 2x / week   PT Duration 8 weeks   PT Treatment/Interventions Manual techniques;Iontophoresis 4mg /ml Dexamethasone;Moist Heat;Therapeutic exercise;Therapeutic activities;Taping;Dry needling;Passive range of motion;Ultrasound;Biofeedback;Electrical Stimulation;Cryotherapy   PT Next Visit Plan Progress with ROM and strengthening per JOSPT protocol.    PT Home Exercise Plan See patient instructions.    Consulted and Agree with Plan of Care Patient        Problem List Patient Active Problem List   Diagnosis Date Noted  . BPH with obstruction/lower urinary tract symptoms 04/12/2015  . Erectile dysfunction 04/12/2015  . Hypogonadism in male 04/12/2015  . Family history of early CAD 09/27/2011  . Chest pain 09/27/2011  . Visit for preventive health examination 09/27/2011    Camree Wigington, PT, MPT, GCS 04/19/2015, 1:53 PM  [facility] [address], [place], [address] Phone: [phone]   Fax:  [phone]

## 2015-04-21 ENCOUNTER — Emergency Department
Admission: EM | Admit: 2015-04-21 | Discharge: 2015-04-21 | Disposition: A | Discharge status: Home / Self Care | Payer: BLUE CROSS/BLUE SHIELD | Attending: Emergency Medicine | Admitting: Emergency Medicine

## 2015-04-21 ENCOUNTER — Ambulatory Visit: Payer: BLUE CROSS/BLUE SHIELD

## 2015-04-21 ENCOUNTER — Emergency Department: Payer: BLUE CROSS/BLUE SHIELD

## 2015-04-21 DIAGNOSIS — R2241 Localized swelling, mass and lump, right lower limb: Secondary | ICD-10-CM | POA: Insufficient documentation

## 2015-04-21 DIAGNOSIS — S8991XA Unspecified injury of right lower leg, initial encounter: Secondary | ICD-10-CM | POA: Diagnosis present

## 2015-04-21 DIAGNOSIS — I10 Essential (primary) hypertension: Secondary | ICD-10-CM | POA: Insufficient documentation

## 2015-04-21 DIAGNOSIS — M25521 Pain in right elbow: Secondary | ICD-10-CM

## 2015-04-21 DIAGNOSIS — Y9289 Other specified places as the place of occurrence of the external cause: Secondary | ICD-10-CM | POA: Insufficient documentation

## 2015-04-21 DIAGNOSIS — Z79899 Other long term (current) drug therapy: Secondary | ICD-10-CM | POA: Insufficient documentation

## 2015-04-21 DIAGNOSIS — Z7982 Long term (current) use of aspirin: Secondary | ICD-10-CM | POA: Diagnosis not present

## 2015-04-21 DIAGNOSIS — M25511 Pain in right shoulder: Secondary | ICD-10-CM | POA: Diagnosis not present

## 2015-04-21 DIAGNOSIS — Y998 Other external cause status: Secondary | ICD-10-CM | POA: Insufficient documentation

## 2015-04-21 DIAGNOSIS — M7989 Other specified soft tissue disorders: Secondary | ICD-10-CM

## 2015-04-21 DIAGNOSIS — X58XXXA Exposure to other specified factors, initial encounter: Secondary | ICD-10-CM | POA: Diagnosis not present

## 2015-04-21 DIAGNOSIS — Y9389 Activity, other specified: Secondary | ICD-10-CM | POA: Diagnosis not present

## 2015-04-21 DIAGNOSIS — M12811 Other specific arthropathies, not elsewhere classified, right shoulder: Secondary | ICD-10-CM

## 2015-04-21 DIAGNOSIS — M25561 Pain in right knee: Secondary | ICD-10-CM

## 2015-04-21 LAB — BASIC METABOLIC PANEL
Anion gap: 8 (ref 5–15)
BUN: 26 mg/dL — ABNORMAL HIGH (ref 6–20)
CALCIUM: 9.4 mg/dL (ref 8.9–10.3)
CHLORIDE: 101 mmol/L (ref 101–111)
CO2: 30 mmol/L (ref 22–32)
CREATININE: 1.17 mg/dL (ref 0.61–1.24)
GFR calc non Af Amer: >60 mL/min (ref >60)
GLUCOSE: 93 mg/dL (ref 65–99)
Potassium: 3.8 mmol/L (ref 3.5–5.1)
SODIUM: 139 mmol/L (ref 135–145)

## 2015-04-21 LAB — CK: CK TOTAL: 102 U/L (ref 49–397)

## 2015-04-21 NOTE — ED Notes (Signed)
Called lab regarding results, stated they are working on it right now.

## 2015-04-21 NOTE — Discharge Instructions (Signed)
Please seek medical attention for any high fevers, chest pain, shortness of breath, change in behavior, persistent vomiting, bloody stool or any other new or concerning symptoms. ° °Edema °Edema is an abnormal buildup of fluids in your body tissues. Edema is somewhat dependent on gravity to pull the fluid to the lowest place in your body. That makes the condition more common in the legs and thighs (lower extremities). Painless swelling of the feet and ankles is common and becomes more likely as you get older. It is also common in looser tissues, like around your eyes.  °When the affected area is squeezed, the fluid may move out of that spot and leave a dent for a few moments. This dent is called pitting.  °CAUSES  °There are many possible causes of edema. Eating too much salt and being on your feet or sitting for a long time can cause edema in your legs and ankles. Hot weather may make edema worse. Common medical causes of edema include: °· Heart failure. °· Liver disease. °· Kidney disease. °· Weak blood vessels in your legs. °· Cancer. °· An injury. °· Pregnancy. °· Some medications. °· Obesity.  °SYMPTOMS  °Edema is usually painless. Your skin may look swollen or shiny.  °DIAGNOSIS  °Your health care provider may be able to diagnose edema by asking about your medical history and doing a physical exam. You may need to have tests such as X-rays, an electrocardiogram, or blood tests to check for medical conditions that may cause edema.  °TREATMENT  °Edema treatment depends on the cause. If you have heart, liver, or kidney disease, you need the treatment appropriate for these conditions. General treatment may include: °· Elevation of the affected body part above the level of your heart. °· Compression of the affected body part. Pressure from elastic bandages or support stockings squeezes the tissues and forces fluid back into the blood vessels. This keeps fluid from entering the tissues. °· Restriction of fluid and  salt intake. °· Use of a water pill (diuretic). These medications are appropriate only for some types of edema. They pull fluid out of your body and make you urinate more often. This gets rid of fluid and reduces swelling, but diuretics can have side effects. Only use diuretics as directed by your health care provider. °HOME CARE INSTRUCTIONS  °· Keep the affected body part above the level of your heart when you are lying down.   °· Do not sit still or stand for prolonged periods.   °· Do not put anything directly under your knees when lying down. °· Do not wear constricting clothing or garters on your upper legs.   °· Exercise your legs to work the fluid back into your blood vessels. This may help the swelling go down.   °· Wear elastic bandages or support stockings to reduce ankle swelling as directed by your health care provider.   °· Eat a low-salt diet to reduce fluid if your health care provider recommends it.   °· Only take medicines as directed by your health care provider.  °SEEK MEDICAL CARE IF:  °· Your edema is not responding to treatment. °· You have heart, liver, or kidney disease and notice symptoms of edema. °· You have edema in your legs that does not improve after elevating them.   °· You have sudden and unexplained weight gain. °SEEK IMMEDIATE MEDICAL CARE IF:  °· You develop shortness of breath or chest pain.   °· You cannot breathe when you lie down. °· You develop pain, redness, or warmth in the swollen   areas.   °· You have heart, liver, or kidney disease and suddenly get edema. °· You have a fever and your symptoms suddenly get worse. °MAKE SURE YOU:  °· Understand these instructions. °· Will watch your condition. °· Will get help right away if you are not doing well or get worse. °Document Released: 09/10/2005 Document Revised: 01/25/2014 Document Reviewed: 07/03/2013 °ExitCare® Patient Information ©2015 [employer]. This information is not intended to replace advice given to you by your  health care provider. Make sure you discuss any questions you have with your health care provider. ° °

## 2015-04-21 NOTE — ED Notes (Addendum)
Pt sent from Urgent Care to rule out blood clot. Pt had knee surgery 03/29/15. Pt states that he wore a knee brace to right knee yesterday, unsure if related. Right calf "cramping", no increased pain with walking or flexion Color WNL

## 2015-04-21 NOTE — ED Notes (Signed)
Patient transported to Ultrasound 

## 2015-04-21 NOTE — Therapy (Signed)
[facility] [address] [place], [address] Phone: [phone]   Fax:  [phone]  Physical Therapy Treatment  Patient Details  Name: Raymond Flowers MRN: [id number] Date of Birth: July 15, 1956 Referring Provider:  Justice Britain, MD  Encounter Date: 04/21/2015      PT End of Session - 04/21/15 1338    Visit Number 6   Number of Visits 17   Date for PT Re-Evaluation 06/13/15   PT Start Time 0900   PT Stop Time 0945   PT Time Calculation (min) 45 min   Equipment Utilized During Treatment Other (comment)  L shoulder sling upon arrival   Activity Tolerance Patient tolerated treatment well   Behavior During Therapy [facility] for tasks assessed/performed      Past Medical History  Diagnosis Date  . Erectile dysfunction   . Blood in semen   . Over weight   . Hypertension   . Benign enlargement of prostate   . Androgen deficiency   . BPH (benign prostatic hyperplasia)   . Impotence     Past Surgical History  Procedure Laterality Date  . Shoulder surgery  2009    right  shoulder x 2  (20016)  . Varicocele excision  8  . Tonsillectomy    . Knee arthroscopy Right 2016  . Tricep surgery Left 2015    There were no vitals filed for this visit.  Visit Diagnosis:  Rotator cuff arthropathy, right  Pain in joint, upper arm, right      Subjective Assessment - 04/21/15 0904    Subjective Patient states he has been using his arm for limited activity but no abduction and no resistance. Denies pain upon arrival.    Pertinent History When patient was 59 years old he sustained a serious injury which injured his scapulae and his pectoral muscle on his RUE. He rehabbed this himself and felt good until he began feeling a cutting sensation in his RUE (which is when he had the acromion resected). Most recently he was lifting weights and while doing a combined shoulder press to eccentrically controlling a lateral  raise he felt a snapping sensation in his RUE and underwent this procedure.    Limitations Lifting   How long can you sit comfortably? Patient works at [employer].    Patient Stated Goals To return to his outdoor hobbies (hunting and fishing).    Currently in Pain? No/denies   Multiple Pain Sites No        Objective: PROM in right shoulder flexion, abduction, IR/ER to pain tolerance, nearing full rom.  Joint mobs AP, distally grade II x 5 bouts each, gentle distraction; Gentle oscillation of humerus for relaxation.  STM to L anterior and lateral deltoid;                          PT Education - 04/21/15 0905    Education provided Yes   Education Details Reinforced precautions   Person(s) Educated Patient   Methods Explanation;Demonstration   Comprehension Verbalized understanding;Returned demonstration          PT Short Term Goals - 04/05/15 1652    PT SHORT TERM GOAL #1   Title Patient will display full PROM with a VAS score of less than 3/10 to demonstrate ability to participate in [facility] for functional activities.            PT Long Term Goals -  04/05/15 1646    PT LONG TERM GOAL #1   Title Patient will be independent with home exercise program to increase his strength, mobility, and function of RUE by 06/13/2015   Status New   PT LONG TERM GOAL #2   Title Patient will report a QuickDash score of less than 42% to indicate a clinically significant improvement in self reported function by 06/13/2015   Baseline 52.3% disability   Status New   PT LONG TERM GOAL #3   Title Patient will display full active ROM WFL of RUE to demonstrate increased function by 06/13/2015.    Baseline No AROM per protocl.    Status New   PT LONG TERM GOAL #4   Title Patient will participate in recreational hobbies such as hunting and fishing with no increase in symptoms to return to functional activities by 06/13/2015.   Baseline Unable to actively use RUE.    PT LONG TERM  GOAL #5   Title Patient will be able to lift at least 10 pounds to demonstrate ability to return to ADLs by 06/13/2015.    Baseline Unable to lift.                Plan - 04/21/15 1339    Clinical Impression Statement Pt continues to demonstrate good tolerance for L shoulder PROM. Mild spasms noted at end range but able to control with breathing and relaxation techniques. Continue HEP and follow-up as scheduled.    Pt will benefit from skilled therapeutic intervention in order to improve on the following deficits Pain;Decreased strength;Impaired UE functional use   Rehab Potential Good   Clinical Impairments Affecting Rehab Potential Second surgery on his RUE with a history of trauma to the area.    PT Frequency 2x / week   PT Duration 8 weeks   PT Treatment/Interventions Manual techniques;Iontophoresis 4mg /ml Dexamethasone;Moist Heat;Therapeutic exercise;Therapeutic activities;Taping;Dry needling;Passive range of motion;Ultrasound;Biofeedback;Electrical Stimulation;Cryotherapy   PT Next Visit Plan Progress with ROM and strengthening per JOSPT protocol.    PT Home Exercise Plan See patient instructions.    Consulted and Agree with Plan of Care Patient        Problem List Patient Active Problem List   Diagnosis Date Noted  . BPH with obstruction/lower urinary tract symptoms 04/12/2015  . Erectile dysfunction 04/12/2015  . Hypogonadism in male 04/12/2015  . Family history of early CAD 09/27/2011  . Chest pain 09/27/2011  . Visit for preventive health examination 09/27/2011   Phillips Grout PT, DPT   Treyton Slimp 04/21/2015, 1:40 PM  [place] [facility] [address] [place], [address] Phone: [phone]   Fax:  [phone]

## 2015-04-21 NOTE — ED Notes (Signed)
Patient tweaked right knee on Monday. Used compression sleeve to knee and ankle/calf became swollen.

## 2015-04-21 NOTE — ED Provider Notes (Signed)
[facility] Emergency Department Provider Note    ____________________________________________  Time seen: 2026  I have reviewed the triage vital signs and the nursing notes.   HISTORY  Chief Complaint DVT and Muscle Pain   History limited by: Not Limited   HPI Raymond Flowers is a 59 y.o. male who presents to the emergency department today because of concerns for right lower extremity swelling. This started today. The patient states that he tweaked his knee and has been wearing a compression stocking around his knee. He states that he was concerned that he might be having a blood clot because of the pain and swelling. He denies any other concerning symptoms.   Past Medical History  Diagnosis Date  . Erectile dysfunction   . Blood in semen   . Over weight   . Hypertension   . Benign enlargement of prostate   . Androgen deficiency   . BPH (benign prostatic hyperplasia)   . Impotence     Patient Active Problem List   Diagnosis Date Noted  . BPH with obstruction/lower urinary tract symptoms 04/12/2015  . Erectile dysfunction 04/12/2015  . Hypogonadism in male 04/12/2015  . Family history of early CAD 09/27/2011  . Chest pain 09/27/2011  . Visit for preventive health examination 09/27/2011    Past Surgical History  Procedure Laterality Date  . Shoulder surgery  2009    right  shoulder x 2  (20016)  . Varicocele excision  8  . Tonsillectomy    . Knee arthroscopy Right 2016  . Tricep surgery Left 2015    Current Outpatient Rx  Name  Route  Sig  Dispense  Refill  . ALPRAZolam (XANAX) 0.25 MG tablet   Oral   Take 0.25 mg by mouth at bedtime as needed for anxiety.         Marland Kitchen aspirin 81 MG tablet   Oral   Take 81 mg by mouth daily.          Hinda Kehr 30 MG/ACT SOLN      APPLY 1 PUMP TO EACH ARMPIT EVERY DAY   90 mL   0   . cyclobenzaprine (FLEXERIL) 10 MG tablet   Oral   Take by mouth.         . diazepam (VALIUM) 5 MG  tablet   Oral   Take 5 mg by mouth every 6 (six) hours as needed for anxiety.         Nyoka Cowden Tea, Camillia sinensis, (GREEN TEA PO)   Oral   Take by mouth.         . hydrochlorothiazide (HYDRODIURIL) 25 MG tablet      TK 1 T PO QD      0   . naproxen (NAPROSYN) 500 MG tablet      TK 1 T PO BID PRN      1   . naproxen sodium (RA NAPROXEN SODIUM) 220 MG tablet   Oral   Take by mouth.         . Omega-3 Fatty Acids (FISH OIL) 1000 MG CAPS   Oral   Take by mouth.           . sildenafil (VIAGRA) 100 MG tablet   Oral   Take by mouth.         . tadalafil (CIALIS) 10 MG tablet   Oral   Take 10 mg by mouth daily as needed.  Allergies Codeine  Family History  Problem Relation Age of Onset  . Heart disease Mother 45  . Hypertension Mother   . Heart disease Father 63    MI   . Hypertension Brother   . Prostate cancer Neg Hx   . Kidney disease Neg Hx   . Kidney Stones Father     mother    Social History History  Substance Use Topics  . Smoking status: Never Smoker   . Smokeless tobacco: Not on file  . Alcohol Use: 1.2 oz/week    1 Standard drinks or equivalent, 1 Glasses of wine per week     Comment: socially    Review of Systems  Constitutional: Negative for fever. Cardiovascular: Negative for chest pain. Respiratory: Negative for shortness of breath. Gastrointestinal: Negative for abdominal pain, vomiting and diarrhea. Genitourinary: Negative for dysuria. Musculoskeletal: Negative for back pain. Right knee pain Skin: Negative for rash. Neurological: Negative for headaches, focal weakness or numbness.   10-point ROS otherwise negative.  ____________________________________________   PHYSICAL EXAM:  VITAL SIGNS: ED Triage Vitals  Enc Vitals Group     BP 04/21/15 1815 140/77 mmHg     Pulse Rate 04/21/15 1815 79     Resp 04/21/15 1815 18     Temp 04/21/15 1815 97.9 F (36.6 C)     Temp Source 04/21/15 1815 Oral      SpO2 04/21/15 1815 100 %     Weight 04/21/15 1815 220 lb (99.791 kg)     Height 04/21/15 1815 5\' 10"  (1.778 m)     Head Cir --      Peak Flow --      Pain Score 04/21/15 1816 1   Constitutional: Alert and oriented. Well appearing and in no distress. Eyes: Conjunctivae are normal. PERRL. Normal extraocular movements. ENT   Head: Normocephalic and atraumatic.   Nose: No congestion/rhinnorhea.   Mouth/Throat: Mucous membranes are moist.   Neck: No stridor. Hematological/Lymphatic/Immunilogical: No cervical lymphadenopathy. Cardiovascular: Normal rate, regular rhythm.  No murmurs, rubs, or gallops. Respiratory: Normal respiratory effort without tachypnea nor retractions. Breath sounds are clear and equal bilaterally. No wheezes/rales/rhonchi. Genitourinary: Deferred Musculoskeletal: Normal range of motion in all extremities. No joint effusions.  No lower extremity tenderness nor edema. Neurologic:  Normal speech and language. No gross focal neurologic deficits are appreciated. Speech is normal.  Skin:  Skin is warm, dry and intact. No rash noted. Psychiatric: Mood and affect are normal. Speech and behavior are normal. Patient exhibits appropriate insight and judgment.  ____________________________________________    LABS (pertinent positives/negatives)  Labs Reviewed  BASIC METABOLIC PANEL - Abnormal; Notable for the following:    BUN 26 (*)    All other components within normal limits  CK     ____________________________________________   EKG  None  ____________________________________________    RADIOLOGY  LE doppler, Right IMPRESSION: No evidence of deep venous thrombosis.  ____________________________________________   PROCEDURES  Procedure(s) performed: None  Critical Care performed: No  ____________________________________________   INITIAL IMPRESSION / ASSESSMENT AND PLAN / ED COURSE  Pertinent labs & imaging results that were available  during my care of the patient were reviewed by me and considered in my medical decision making (see chart for details).  Patient presents to the emergency department today because of concerns for right lower extremity swelling. Ultrasound did not show any evidence of blood clots. In addition his blood work did not have any concerning findings. I think likely he had some dependent edema after  wearing a compression stocking around his knee. I did discuss with patient and instructed patient follow-up with his orthopedic doctor given recent injury.  ____________________________________________   FINAL CLINICAL IMPRESSION(S) / ED DIAGNOSES  Final diagnoses:  Right knee pain  Leg swelling     Nance Pear, MD 04/21/15 2302

## 2015-04-22 ENCOUNTER — Encounter: Payer: Self-pay | Admitting: Physical Therapy

## 2015-04-25 ENCOUNTER — Ambulatory Visit: Payer: BLUE CROSS/BLUE SHIELD | Attending: Orthopedic Surgery | Admitting: Physical Therapy

## 2015-04-25 DIAGNOSIS — M12511 Traumatic arthropathy, right shoulder: Secondary | ICD-10-CM | POA: Insufficient documentation

## 2015-04-25 DIAGNOSIS — M79621 Pain in right upper arm: Secondary | ICD-10-CM | POA: Insufficient documentation

## 2015-04-25 DIAGNOSIS — M12811 Other specific arthropathies, not elsewhere classified, right shoulder: Secondary | ICD-10-CM

## 2015-04-25 NOTE — Therapy (Signed)
[facility] [address] [place], [address] Phone: [phone]   Fax:  [phone]  Physical Therapy Treatment  Patient Details  Name: Raymond Flowers MRN: [id number] Date of Birth: Aug 31, 1956 Referring Provider:  Justice Britain, MD  Encounter Date: 04/25/2015      PT End of Session - 04/25/15 1110    Visit Number 7   Number of Visits 17   Date for PT Re-Evaluation 06/13/15   PT Start Time 0945   PT Stop Time 1030   PT Time Calculation (min) 45 min   Activity Tolerance Patient tolerated treatment well   Behavior During Therapy [facility] for tasks assessed/performed      Past Medical History  Diagnosis Date  . Erectile dysfunction   . Blood in semen   . Over weight   . Hypertension   . Benign enlargement of prostate   . Androgen deficiency   . BPH (benign prostatic hyperplasia)   . Impotence     Past Surgical History  Procedure Laterality Date  . Shoulder surgery  2009    right  shoulder x 2  ([address]16)  . Varicocele excision  8  . Tonsillectomy    . Knee arthroscopy Right 2016  . Tricep surgery Left 2015    There were no vitals filed for this visit.  Visit Diagnosis:  Rotator cuff arthropathy, right      Subjective Assessment - 04/25/15 1104    Subjective Pt has had high degree of knee pain over the past few days, will be seeing his orthopedist regarding this. Also has incr. shoulder pain possibly due to his using his arm more than he is supposed to. PT encouraged pt to remember and to follow protocol.   Currently in Pain? Yes   Pain Score 3    Pain Location Knee   Pain Orientation Right         Objective: Shoulder PROM  For flexion, abduction, ER, IR. Limited primarily with flexion which improved with pt closing eyes possibly because he was guarding any time he could see his arm.  Biceps STM due to significant c/o pain with PROM for elbow - performed elbow ROM following this for  flexion/extension and pronation/supination.  Inferior glides in 30 and 90 degrees abduction 3x30 mobs grade II.  Posterior glides in neutral 3x 30 grade II.  Pt tolerated therapy well, required cuing throughout session to relax and to tolerate therapy. Following posterior glides pt had definite improvement in ER ROM and decr. Pain with PROM.                        PT Education - 04/25/15 1110    Education provided Yes   Education Details reinforced precautions   Person(s) Educated Patient   Methods Explanation   Comprehension Verbalized understanding;Returned demonstration          PT Short Term Goals - 04/05/15 1652    PT SHORT TERM GOAL #1   Title Patient will display full PROM with a VAS score of less than 3/10 to demonstrate ability to participate in [facility] for functional activities.            PT Long Term Goals - 04/05/15 1646    PT LONG TERM GOAL #1   Title Patient will be independent with home exercise program to increase his strength, mobility, and function of RUE by 06/13/2015   Status New   PT  LONG TERM GOAL #2   Title Patient will report a QuickDash score of less than 42% to indicate a clinically significant improvement in self reported function by 06/13/2015   Baseline 52.3% disability   Status New   PT LONG TERM GOAL #3   Title Patient will display full active ROM WFL of RUE to demonstrate increased function by 06/13/2015.    Baseline No AROM per protocl.    Status New   PT LONG TERM GOAL #4   Title Patient will participate in recreational hobbies such as hunting and fishing with no increase in symptoms to return to functional activities by 06/13/2015.   Baseline Unable to actively use RUE.    PT LONG TERM GOAL #5   Title Patient will be able to lift at least 10 pounds to demonstrate ability to return to ADLs by 06/13/2015.    Baseline Unable to lift.                Plan - 04/25/15 1112    Clinical Impression Statement Pt required  cuing throughout session to minimize "helping". Spasms at end range which able to relax. ROM is excelllent passively and pt may be appropriate for advancement pending discussion with MD. PT will call to speak with MD this week.    Pt will benefit from skilled therapeutic intervention in order to improve on the following deficits Pain;Decreased strength;Impaired UE functional use   Clinical Impairments Affecting Rehab Potential Second surgery on his RUE with a history of trauma to the area.    PT Frequency 2x / week   PT Duration 8 weeks        Problem List Patient Active Problem List   Diagnosis Date Noted  . BPH with obstruction/lower urinary tract symptoms 04/12/2015  . Erectile dysfunction 04/12/2015  . Hypogonadism in male 04/12/2015  . Family history of early CAD 09/27/2011  . Chest pain 09/27/2011  . Visit for preventive health examination 09/27/2011    [name],[name] 04/25/2015, 11:56 AM  [place] [facility] [address], [place], [address] Phone: [phone]   Fax:  [phone]

## 2015-04-28 ENCOUNTER — Ambulatory Visit: Payer: BLUE CROSS/BLUE SHIELD | Admitting: Physical Therapy

## 2015-04-28 ENCOUNTER — Encounter: Payer: Self-pay | Admitting: Physical Therapy

## 2015-04-28 DIAGNOSIS — M12511 Traumatic arthropathy, right shoulder: Secondary | ICD-10-CM | POA: Diagnosis not present

## 2015-04-28 DIAGNOSIS — M25521 Pain in right elbow: Secondary | ICD-10-CM

## 2015-04-28 DIAGNOSIS — M12811 Other specific arthropathies, not elsewhere classified, right shoulder: Secondary | ICD-10-CM

## 2015-04-28 NOTE — Therapy (Signed)
[facility] [address] [place], [address] Phone: [phone]   Fax:  [phone]  Physical Therapy Treatment  Patient Details  Name: Raymond Flowers MRN: [id number] Date of Birth: 06/01/1956 Referring Provider:  Justice Britain, MD  Encounter Date: 04/28/2015      PT End of Session - 04/28/15 1018    Visit Number 8   Number of Visits 17   Date for PT Re-Evaluation 06/13/15   PT Start Time 0900   PT Stop Time 0945   PT Time Calculation (min) 45 min   Activity Tolerance Patient tolerated treatment well   Behavior During Therapy [facility] for tasks assessed/performed      Past Medical History  Diagnosis Date  . Erectile dysfunction   . Blood in semen   . Over weight   . Hypertension   . Benign enlargement of prostate   . Androgen deficiency   . BPH (benign prostatic hyperplasia)   . Impotence     Past Surgical History  Procedure Laterality Date  . Shoulder surgery  2009    right  shoulder x 2  (20016)  . Varicocele excision  8  . Tonsillectomy    . Knee arthroscopy Right 2016  . Tricep surgery Left 2015    There were no vitals filed for this visit.  Visit Diagnosis:  Rotator cuff arthropathy, right  Pain in joint, upper arm, right      Subjective Assessment - 04/28/15 1015    Subjective Pt reports mild pain in posterior shoulder "maybe a 1" today. He has been trying harder to keep his arm in the sling and to not use his UE which has helped his pain level.   Currently in Pain? Yes   Pain Score 1    Pain Location Arm   Pain Orientation Right            objective: UE ranger limited ROM seated for shoulder protraction/retraction 3x1 min, side to side (5" excursion) also 3x1 min with cuing to maintain neutral back posture.  PROM in supine for flexion, abduction, ER/IR initially with incr. Pain and guarding, following UE ranger pt able to tolerate full PROM except IR limited and  painful.  Inferior glides at 90 degr. Abduction 3x30 grade II - following this IR improved by 15 degrees.  PT encouraged pt to continue wearing his sling when in public and at night, also reminded pt that his instruction from MD is to maintain the use of his sling until D/C'd by MD next Wednesday.                     PT Education - 04/28/15 1017    Education provided Yes   Education Details educated pt on very gentle AAROM as tolerated.   Person(s) Educated Patient   Methods Explanation   Comprehension Verbalized understanding;Returned demonstration          PT Short Term Goals - 04/05/15 1652    PT SHORT TERM GOAL #1   Title Patient will display full PROM with a VAS score of less than 3/10 to demonstrate ability to participate in [facility] for functional activities.            PT Long Term Goals - 04/05/15 1646    PT LONG TERM GOAL #1   Title Patient will be independent with home exercise program to increase his strength, mobility, and function of RUE by 06/13/2015  Status New   PT LONG TERM GOAL #2   Title Patient will report a QuickDash score of less than 42% to indicate a clinically significant improvement in self reported function by 06/13/2015   Baseline 52.3% disability   Status New   PT LONG TERM GOAL #3   Title Patient will display full active ROM WFL of RUE to demonstrate increased function by 06/13/2015.    Baseline No AROM per protocl.    Status New   PT LONG TERM GOAL #4   Title Patient will participate in recreational hobbies such as hunting and fishing with no increase in symptoms to return to functional activities by 06/13/2015.   Baseline Unable to actively use RUE.    PT LONG TERM GOAL #5   Title Patient will be able to lift at least 10 pounds to demonstrate ability to return to ADLs by 06/13/2015.    Baseline Unable to lift.                Plan - 04/28/15 1019    Clinical Impression Statement Pt had significant difficulty/worse than  usual/ relaxing his arm. After performance of AAROM exercises pt had definite improvement in shoulder relaxation and able to achieve full PROM allowable per protocol.   Pt will benefit from skilled therapeutic intervention in order to improve on the following deficits Pain;Decreased strength;Impaired UE functional use        Problem List Patient Active Problem List   Diagnosis Date Noted  . BPH with obstruction/lower urinary tract symptoms 04/12/2015  . Erectile dysfunction 04/12/2015  . Hypogonadism in male 04/12/2015  . Family history of early CAD 09/27/2011  . Chest pain 09/27/2011  . Visit for preventive health examination 09/27/2011    [name],[name] 04/28/2015, 10:22 AM  [facility] [address] [place], [address] Phone: [phone]   Fax:  [phone]

## 2015-05-02 ENCOUNTER — Encounter: Payer: BLUE CROSS/BLUE SHIELD | Admitting: Physical Therapy

## 2015-05-02 ENCOUNTER — Ambulatory Visit: Payer: BLUE CROSS/BLUE SHIELD | Admitting: Physical Therapy

## 2015-05-02 DIAGNOSIS — M25521 Pain in right elbow: Secondary | ICD-10-CM

## 2015-05-02 DIAGNOSIS — M12511 Traumatic arthropathy, right shoulder: Secondary | ICD-10-CM | POA: Diagnosis not present

## 2015-05-02 DIAGNOSIS — M12811 Other specific arthropathies, not elsewhere classified, right shoulder: Secondary | ICD-10-CM

## 2015-05-02 NOTE — Therapy (Signed)
[facility] [address] [place], [address] Phone: [phone]   Fax:  [phone]  Physical Therapy Treatment  Patient Details  Name: Raymond Flowers MRN: [id number] Date of Birth: [birth year] Referring Provider:  Justice Britain, MD  Encounter Date: 05/02/2015      PT End of Session - 05/02/15 0915    Visit Number 9   Number of Visits 17   Date for PT Re-Evaluation 06/13/15   PT Start Time 0900   PT Stop Time 0945   PT Time Calculation (min) 45 min   Activity Tolerance Patient tolerated treatment well      Past Medical History  Diagnosis Date  . Erectile dysfunction   . Blood in semen   . Over weight   . Hypertension   . Benign enlargement of prostate   . Androgen deficiency   . BPH (benign prostatic hyperplasia)   . Impotence     Past Surgical History  Procedure Laterality Date  . Shoulder surgery  2009    right  shoulder x 2  (20016)  . Varicocele excision  8  . Tonsillectomy    . Knee arthroscopy Right 2016  . Tricep surgery Left 2015    There were no vitals filed for this visit.  Visit Diagnosis:  Rotator cuff arthropathy, right  Pain in joint, upper arm, right      Subjective Assessment - 05/02/15 0903    Subjective Pt reports he has been avoiding movement and is doing his very low level AAROM activity in addition to his pendulums et al. Pt has had two instances of waking up with his arm up over his head but no pain.   Currently in Pain? Yes   Pain Score 1    Pain Location Arm             Objective: AAROM with UE ranger in seated with added 4 " height forward/back, rainbow arc with cuing to avoid pain. Pt reported pain in posterior shoulder with horizontal adduction so performed manual STM on shoulder to address this, following STM pt had improvement in ROM.  YTB scapular retraction with manual tapping for engaging paraspinals, 3x10.  PROM stretching for  abduction, flexion, ER, IR. Pt had difficulty relaxing especially with flexion and ER, with cuing to close eyes and distractions offered pt able to relax to allow greater ROM.                    PT Education - 05/02/15 0907    Education provided Yes   Education Details reinforced AAROM and avoiding movement into pain   Person(s) Educated Patient   Methods Explanation   Comprehension Verbalized understanding;Returned demonstration          PT Short Term Goals - 04/05/15 1652    PT SHORT TERM GOAL #1   Title Patient will display full PROM with a VAS score of less than 3/10 to demonstrate ability to participate in [facility] for functional activities.            PT Long Term Goals - 04/05/15 1646    PT LONG TERM GOAL #1   Title Patient will be independent with home exercise program to increase his strength, mobility, and function of RUE by 06/13/2015   Status New   PT LONG TERM GOAL #2   Title Patient will report a QuickDash score of less than 42% to indicate a clinically significant improvement in  self reported function by 06/13/2015   Baseline 52.3% disability   Status New   PT LONG TERM GOAL #3   Title Patient will display full active ROM WFL of RUE to demonstrate increased function by 06/13/2015.    Baseline No AROM per protocl.    Status New   PT LONG TERM GOAL #4   Title Patient will participate in recreational hobbies such as hunting and fishing with no increase in symptoms to return to functional activities by 06/13/2015.   Baseline Unable to actively use RUE.    PT LONG TERM GOAL #5   Title Patient will be able to lift at least 10 pounds to demonstrate ability to return to ADLs by 06/13/2015.    Baseline Unable to lift.                Plan - 05/02/15 0936    Clinical Impression Statement Currently ROM limitations appear to be primarily limited to muscle guarding. With cuing to relax and mobs. pt able to achieve nearly full shoulder flexion PROM (-5  degrees). ER to 35 degrees, IR to 30 degrees. Pt has notably protracted scapula. Pt also has tendency to shrug shoulder even with PROM for shoulder flexion and abduction. Pt overall is making significant progress and would benefit from progression to next round of protocol pending visit to MD.   Pt will benefit from skilled therapeutic intervention in order to improve on the following deficits Pain;Decreased strength;Impaired UE functional use   PT Frequency 2x / week   PT Treatment/Interventions Manual techniques;Iontophoresis 4mg /ml Dexamethasone;Moist Heat;Therapeutic exercise;Therapeutic activities;Taping;Dry needling;Passive range of motion;Ultrasound;Biofeedback;Electrical Stimulation;Cryotherapy        Problem List Patient Active Problem List   Diagnosis Date Noted  . BPH with obstruction/lower urinary tract symptoms 04/12/2015  . Erectile dysfunction 04/12/2015  . Hypogonadism in male 04/12/2015  . Family history of early CAD 09/27/2011  . Chest pain 09/27/2011  . Visit for preventive health examination 09/27/2011    [name],[name] 05/02/2015, 10:16 AM  [facility] [address], [place], [address] Phone: [phone]   Fax:  [phone]

## 2015-05-05 ENCOUNTER — Encounter: Payer: BLUE CROSS/BLUE SHIELD | Admitting: Physical Therapy

## 2015-05-05 ENCOUNTER — Ambulatory Visit: Payer: BLUE CROSS/BLUE SHIELD | Admitting: Physical Therapy

## 2015-05-05 DIAGNOSIS — M12811 Other specific arthropathies, not elsewhere classified, right shoulder: Secondary | ICD-10-CM

## 2015-05-05 DIAGNOSIS — M25521 Pain in right elbow: Secondary | ICD-10-CM

## 2015-05-05 DIAGNOSIS — M12511 Traumatic arthropathy, right shoulder: Secondary | ICD-10-CM | POA: Diagnosis not present

## 2015-05-05 NOTE — Therapy (Signed)
[facility] [address] [place], [address] Phone: [phone]   Fax:  [phone]  Physical Therapy Treatment  Patient Details  Name: Raymond Flowers MRN: [id number] Date of Birth: Nov 14, 1955 Referring Provider:  Justice Britain, MD  Encounter Date: 05/05/2015      PT End of Session - 05/05/15 1102    Visit Number 10   Number of Visits 17   PT Start Time 0950   PT Stop Time 1030   PT Time Calculation (min) 40 min   Activity Tolerance Patient tolerated treatment well   Behavior During Therapy [facility] for tasks assessed/performed      Past Medical History  Diagnosis Date  . Erectile dysfunction   . Blood in semen   . Over weight   . Hypertension   . Benign enlargement of prostate   . Androgen deficiency   . BPH (benign prostatic hyperplasia)   . Impotence     Past Surgical History  Procedure Laterality Date  . Shoulder surgery  2009    right  shoulder x 2  (20016)  . Varicocele excision  8  . Tonsillectomy    . Knee arthroscopy Right 2016  . Tricep surgery Left 2015    There were no vitals filed for this visit.  Visit Diagnosis:  Rotator cuff arthropathy, right  Pain in joint, upper arm, right      Subjective Assessment - 05/05/15 0958    Subjective Pt reports he has seen MD who feels he is ready for AAROM stage. Pt reports incr. soreness today but no pain.    Currently in Pain? No/denies           Objective: Extensive education/confirmation of pt's progress per protocol and answered questions regarding this.  Introduced parameters for low level AAROM based on patient response.  UE ranger seated plus sign drawing x5 min.  Cane supine: Press 3x5 with cuing to use uninvolved UE primarily for movement.  Shoulder flexion 3x8 with similar encouragement.  Shoulder abduction in seated 3x10 with manual assistance, unable to perform appropriately with too much muscle firing  otherwise.  Attempted pulley but this incr. Pain so d/c for now.  Pt required considerable encouragement to continue with activity in a safe manner, to moderate activity based on pain/fatigue.                      PT Education - 05/05/15 1044    Education provided Yes   Education Details reemphasized progression towards protocol.   Person(s) Educated Patient   Methods Explanation          PT Short Term Goals - 04/05/15 1652    PT SHORT TERM GOAL #1   Title Patient will display full PROM with a VAS score of less than 3/10 to demonstrate ability to participate in [facility] for functional activities.            PT Long Term Goals - 04/05/15 1646    PT LONG TERM GOAL #1   Title Patient will be independent with home exercise program to increase his strength, mobility, and function of RUE by 06/13/2015   Status New   PT LONG TERM GOAL #2   Title Patient will report a QuickDash score of less than 42% to indicate a clinically significant improvement in self reported function by 06/13/2015   Baseline 52.3% disability   Status New   PT LONG TERM GOAL #3  Title Patient will display full active ROM WFL of RUE to demonstrate increased function by 06/13/2015.    Baseline No AROM per protocl.    Status New   PT LONG TERM GOAL #4   Title Patient will participate in recreational hobbies such as hunting and fishing with no increase in symptoms to return to functional activities by 06/13/2015.   Baseline Unable to actively use RUE.    PT LONG TERM GOAL #5   Title Patient will be able to lift at least 10 pounds to demonstrate ability to return to ADLs by 06/13/2015.    Baseline Unable to lift.                Plan - 05/05/15 1103    Clinical Impression Statement AAROM is WNL for supine wand exercises however pt is not yet ready to progress to standing or pulley exercises as these both incr. pain. Pt needs continued education and reinforcement to avoid excessive use of  involved UE to maintain safety post surgery.   Pt will benefit from skilled therapeutic intervention in order to improve on the following deficits Pain;Decreased strength;Impaired UE functional use   Rehab Potential Good        Problem List Patient Active Problem List   Diagnosis Date Noted  . BPH with obstruction/lower urinary tract symptoms 04/12/2015  . Erectile dysfunction 04/12/2015  . Hypogonadism in male 04/12/2015  . Family history of early CAD 09/27/2011  . Chest pain 09/27/2011  . Visit for preventive health examination 09/27/2011    Taneil Lazarus 05/05/2015, 11:12 AM  [facility] [address], [place], [address] Phone: [phone]   Fax:  [phone]

## 2015-05-06 ENCOUNTER — Telehealth: Payer: Self-pay

## 2015-05-06 NOTE — Telephone Encounter (Signed)
This is a [employer], [employer] pt. Pt pharmacy sent a refill request for axiron. Pt was last seen 04/12/15. His last testosterone was 852. Shannon's note states pt is doing well on medication but no mention of refills. Please advise.

## 2015-05-09 ENCOUNTER — Encounter: Payer: BLUE CROSS/BLUE SHIELD | Admitting: Physical Therapy

## 2015-05-09 ENCOUNTER — Ambulatory Visit: Payer: BLUE CROSS/BLUE SHIELD | Admitting: Physical Therapy

## 2015-05-09 DIAGNOSIS — M25521 Pain in right elbow: Secondary | ICD-10-CM

## 2015-05-09 DIAGNOSIS — M12511 Traumatic arthropathy, right shoulder: Secondary | ICD-10-CM | POA: Diagnosis not present

## 2015-05-09 DIAGNOSIS — M12811 Other specific arthropathies, not elsewhere classified, right shoulder: Secondary | ICD-10-CM

## 2015-05-09 NOTE — Therapy (Signed)
[facility] [address] [place], [address] Phone: [phone]   Fax:  [phone]  Physical Therapy Treatment  Patient Details  Name: Raymond Flowers MRN: [id number] Date of Birth: [birth year] Referring Provider:  Justice Britain, MD  Encounter Date: 05/09/2015      PT End of Session - 05/09/15 1014    Visit Number 11   Number of Visits 17   Date for PT Re-Evaluation 06/13/15   PT Start Time 0930   PT Stop Time 1012   PT Time Calculation (min) 42 min   Activity Tolerance Patient tolerated treatment well      Past Medical History  Diagnosis Date  . Erectile dysfunction   . Blood in semen   . Over weight   . Hypertension   . Benign enlargement of prostate   . Androgen deficiency   . BPH (benign prostatic hyperplasia)   . Impotence     Past Surgical History  Procedure Laterality Date  . Shoulder surgery  2009    right  shoulder x 2  ([address]16)  . Varicocele excision  8  . Tonsillectomy    . Knee arthroscopy Right 2016  . Tricep surgery Left 2015    There were no vitals filed for this visit.  Visit Diagnosis:  Pain in joint, upper arm, right  Rotator cuff arthropathy, right      Subjective Assessment - 05/09/15 0930    Subjective (p) Pt had one instance of pushing up from a table with his involved UE. Pt did have incr. pain for a short period of time but he has    Pertinent History (p) When patient was 59 years old he sustained a serious injury which injured his scapulae and his pectoral muscle on his RUE. He rehabbed this himself and felt good until he began feeling a cutting sensation in his RUE (which is when he had the acromion resected). Most recently he was lifting weights and while doing a combined shoulder press to eccentrically controlling a lateral raise he felt a snapping sensation in his RUE and underwent this procedure.           Objective: Extensive practice and  reassessment of wand supine AAROM exercise for shoulder press, shoulder flexion, seated ER, seated abduction. Pt required cuing to decr. "help" from involved UE.  Instruction in and performance of towel IR stretch.  STM performed on R deltoid, biceps where pt had pain following his accidental use of UE yesterday. Following this pt reported decr. Soreness in region with PROM stretching.  PROM stretch for ER/IR - IR limited and painful but improved with manual relocation of humeral head onto glenoid fossa via AP mob.                       PT Education - 05/09/15 1011    Education provided Yes   Education Details educated pt on self IR stretch with towel with appropriate posture.   Person(s) Educated Patient   Methods Explanation   Comprehension Verbalized understanding;Returned demonstration          PT Short Term Goals - 04/05/15 1652    PT SHORT TERM GOAL #1   Title Patient will display full PROM with a VAS score of less than 3/10 to demonstrate ability to participate in [facility] for functional activities.            PT Long Term Goals - 04/05/15 1646  PT LONG TERM GOAL #1   Title Patient will be independent with home exercise program to increase his strength, mobility, and function of RUE by 06/13/2015   Status New   PT LONG TERM GOAL #2   Title Patient will report a QuickDash score of less than 42% to indicate a clinically significant improvement in self reported function by 06/13/2015   Baseline 52.3% disability   Status New   PT LONG TERM GOAL #3   Title Patient will display full active ROM WFL of RUE to demonstrate increased function by 06/13/2015.    Baseline No AROM per protocl.    Status New   PT LONG TERM GOAL #4   Title Patient will participate in recreational hobbies such as hunting and fishing with no increase in symptoms to return to functional activities by 06/13/2015.   Baseline Unable to actively use RUE.    PT LONG TERM GOAL #5   Title  Patient will be able to lift at least 10 pounds to demonstrate ability to return to ADLs by 06/13/2015.    Baseline Unable to lift.                Plan - 05/09/15 1014    Clinical Impression Statement Pt continuing to make progress - AAROM is WNL for all movements except ER and IR. Pt is making good progress per prtocol requiring cuing and manual correcting for appropriate performance of protocol limited activity.   Pt will benefit from skilled therapeutic intervention in order to improve on the following deficits Pain;Decreased strength;Impaired UE functional use   Rehab Potential Good   PT Frequency 2x / week   PT Duration 8 weeks   PT Treatment/Interventions Manual techniques;Iontophoresis 4mg /ml Dexamethasone;Moist Heat;Therapeutic exercise;Therapeutic activities;Taping;Dry needling;Passive range of motion;Ultrasound;Biofeedback;Electrical Stimulation;Cryotherapy        Problem List Patient Active Problem List   Diagnosis Date Noted  . BPH with obstruction/lower urinary tract symptoms 04/12/2015  . Erectile dysfunction 04/12/2015  . Hypogonadism in male 04/12/2015  . Family history of early CAD 09/27/2011  . Chest pain 09/27/2011  . Visit for preventive health examination 09/27/2011    [name],[name] 05/09/2015, 10:17 AM  [facility] [address], [place], [address] Phone: [phone]   Fax:  [phone]

## 2015-05-10 NOTE — Telephone Encounter (Signed)
Patient was going to check his refills when he got home and then let us know.  We can refill.

## 2015-05-11 ENCOUNTER — Ambulatory Visit: Payer: BLUE CROSS/BLUE SHIELD | Admitting: Physical Therapy

## 2015-05-11 ENCOUNTER — Encounter: Payer: BLUE CROSS/BLUE SHIELD | Admitting: Physical Therapy

## 2015-05-11 DIAGNOSIS — M12511 Traumatic arthropathy, right shoulder: Secondary | ICD-10-CM | POA: Diagnosis not present

## 2015-05-11 DIAGNOSIS — M12811 Other specific arthropathies, not elsewhere classified, right shoulder: Secondary | ICD-10-CM

## 2015-05-11 DIAGNOSIS — M25521 Pain in right elbow: Secondary | ICD-10-CM

## 2015-05-11 NOTE — Therapy (Signed)
[facility] [address] [place], [address] Phone: [phone]   Fax:  [phone]  Physical Therapy Treatment  Patient Details  Name: Raymond Flowers MRN: [id number] Date of Birth: 06/30/1956 Referring Provider:  Justice Britain, MD  Encounter Date: 05/11/2015      PT End of Session - 05/11/15 0959    Visit Number 12   Number of Visits 17   Date for PT Re-Evaluation 06/13/15   PT Start Time 0945   PT Stop Time 1030   PT Time Calculation (min) 45 min   Activity Tolerance Patient tolerated treatment well   Behavior During Therapy [facility] for tasks assessed/performed      Past Medical History  Diagnosis Date  . Erectile dysfunction   . Blood in semen   . Over weight   . Hypertension   . Benign enlargement of prostate   . Androgen deficiency   . BPH (benign prostatic hyperplasia)   . Impotence     Past Surgical History  Procedure Laterality Date  . Shoulder surgery  2009    right  shoulder x 2  (20016)  . Varicocele excision  8  . Tonsillectomy    . Knee arthroscopy Right 2016  . Tricep surgery Left 2015    There were no vitals filed for this visit.  Visit Diagnosis:  Pain in joint, upper arm, right  Rotator cuff arthropathy, right      Subjective Assessment - 05/11/15 0956    Subjective Pt reports continued soreness with exercise. No pain but he is assiduously performing his HEP.   Currently in Pain? No/denies           Objective: Progressed AAROM with special focus today on avoiding scapular shrug: UE ranger on wall flexion with wt shifting 3x8, manual correction for scapula UE ranger alphabet performed small with cuing for engaged scap. Ball rolling up bannister 3x10. Scapular isometrics performed at 20% capacity for shoulder abduction, extension, flexion each performed 3x10 with 10 sec. Holds.  INstructed pt in performance of ball and isometrics for HEP, pt verbalized  understanding.                      PT Education - 05/11/15 0957    Education provided Yes   Education Details reeducated pt on protocol, will progress to AROM in the next week as appropriate.   Person(s) Educated Patient   Methods Explanation   Comprehension Verbalized understanding;Returned demonstration          PT Short Term Goals - 04/05/15 1652    PT SHORT TERM GOAL #1   Title Patient will display full PROM with a VAS score of less than 3/10 to demonstrate ability to participate in [facility] for functional activities.            PT Long Term Goals - 04/05/15 1646    PT LONG TERM GOAL #1   Title Patient will be independent with home exercise program to increase his strength, mobility, and function of RUE by 06/13/2015   Status New   PT LONG TERM GOAL #2   Title Patient will report a QuickDash score of less than 42% to indicate a clinically significant improvement in self reported function by 06/13/2015   Baseline 52.3% disability   Status New   PT LONG TERM GOAL #3   Title Patient will display full active ROM WFL of RUE to demonstrate increased function by 06/13/2015.  Baseline No AROM per protocl.    Status New   PT LONG TERM GOAL #4   Title Patient will participate in recreational hobbies such as hunting and fishing with no increase in symptoms to return to functional activities by 06/13/2015.   Baseline Unable to actively use RUE.    PT LONG TERM GOAL #5   Title Patient will be able to lift at least 10 pounds to demonstrate ability to return to ADLs by 06/13/2015.    Baseline Unable to lift.                Plan - 05/11/15 1016    Clinical Impression Statement Pt able to perform higher level AAROM exercises of ball rolling with good control, no modification/compensation. Pt may be appropriate for progression to AROM - PT will call to discuss with MD.   Rehab Potential Good   Clinical Impairments Affecting Rehab Potential Second surgery on his RUE  with a history of trauma to the area.    PT Frequency 2x / week   PT Duration 8 weeks   PT Treatment/Interventions Manual techniques;Iontophoresis 4mg /ml Dexamethasone;Moist Heat;Therapeutic exercise;Therapeutic activities;Taping;Dry needling;Passive range of motion;Ultrasound;Biofeedback;Electrical Stimulation;Cryotherapy        Problem List Patient Active Problem List   Diagnosis Date Noted  . BPH with obstruction/lower urinary tract symptoms 04/12/2015  . Erectile dysfunction 04/12/2015  . Hypogonadism in male 04/12/2015  . Family history of early CAD 09/27/2011  . Chest pain 09/27/2011  . Visit for preventive health examination 09/27/2011    Kaston Faughn 05/11/2015, 11:57 AM  [facility] [address] [place], [address] Phone: [phone]   Fax:  [phone]

## 2015-05-16 ENCOUNTER — Ambulatory Visit: Payer: BLUE CROSS/BLUE SHIELD | Attending: Orthopedic Surgery | Admitting: Physical Therapy

## 2015-05-16 DIAGNOSIS — M12811 Other specific arthropathies, not elsewhere classified, right shoulder: Secondary | ICD-10-CM

## 2015-05-16 DIAGNOSIS — M79621 Pain in right upper arm: Secondary | ICD-10-CM | POA: Diagnosis not present

## 2015-05-16 DIAGNOSIS — M12511 Traumatic arthropathy, right shoulder: Secondary | ICD-10-CM | POA: Insufficient documentation

## 2015-05-16 DIAGNOSIS — M25521 Pain in right elbow: Secondary | ICD-10-CM

## 2015-05-16 NOTE — Therapy (Signed)
[facility] [address] [place], [address] Phone: [phone]   Fax:  [phone]  Physical Therapy Treatment  Patient Details  Name: Raymond Flowers MRN: [id number] Date of Birth: Feb 27, 1956 Referring Provider:  Justice Britain, MD  Encounter Date: 05/16/2015      PT End of Session - 05/16/15 0909    Visit Number 13   Number of Visits 17   Date for PT Re-Evaluation 06/13/15   PT Start Time 0815   PT Stop Time 0900   PT Time Calculation (min) 45 min   Activity Tolerance Patient tolerated treatment well   Behavior During Therapy [facility] for tasks assessed/performed      Past Medical History  Diagnosis Date  . Erectile dysfunction   . Blood in semen   . Over weight   . Hypertension   . Benign enlargement of prostate   . Androgen deficiency   . BPH (benign prostatic hyperplasia)   . Impotence     Past Surgical History  Procedure Laterality Date  . Shoulder surgery  2009    right  shoulder x 2  (20016)  . Varicocele excision  8  . Tonsillectomy    . Knee arthroscopy Right 2016  . Tricep surgery Left 2015    There were no vitals filed for this visit.  Visit Diagnosis:  Pain in joint, upper arm, right  Rotator cuff arthropathy, right      Subjective Assessment - 05/16/15 0907    Subjective Pt reports no soreness today, he has continued with HEP as appropriate.   How long can you sit comfortably? Patient works at [employer].    Patient Stated Goals To return to his outdoor hobbies (hunting and fishing).    Currently in Pain? No/denies              Objective: Supine shoulder flexion AROM with manual assistance initially, 3x10. Supine HBH with elbow flutters 3x10 Attempted supine HADD but unable to perform. Supine 70 degr. Abduction shoulder ER/IR x3 min total. Extensive education about avoiding scapular tilt with this. Pt performed standing scapular control exercises against wall  to facilitate this. Able to perform initially with manual assist, eventually with vebal cuing.  Alphabet performed with UE ranger - pt reported incr. Soreness with this.  Overall pt did well with basic low level AROM exercises, PT issued these as HEP. Look to progress these next session.                  PT Education - 05/16/15 0908    Education provided Yes   Education Details educated pt on low level AROM exercises and  postural necessities.   Person(s) Educated Patient   Methods Explanation   Comprehension Verbalized understanding;Returned demonstration          PT Short Term Goals - 04/05/15 1652    PT SHORT TERM GOAL #1   Title Patient will display full PROM with a VAS score of less than 3/10 to demonstrate ability to participate in [facility] for functional activities.            PT Long Term Goals - 04/05/15 1646    PT LONG TERM GOAL #1   Title Patient will be independent with home exercise program to increase his strength, mobility, and function of RUE by 06/13/2015   Status New   PT LONG TERM GOAL #2   Title Patient will report a QuickDash score of less  than 42% to indicate a clinically significant improvement in self reported function by 06/13/2015   Baseline 52.3% disability   Status New   PT LONG TERM GOAL #3   Title Patient will display full active ROM WFL of RUE to demonstrate increased function by 06/13/2015.    Baseline No AROM per protocl.    Status New   PT LONG TERM GOAL #4   Title Patient will participate in recreational hobbies such as hunting and fishing with no increase in symptoms to return to functional activities by 06/13/2015.   Baseline Unable to actively use RUE.    PT LONG TERM GOAL #5   Title Patient will be able to lift at least 10 pounds to demonstrate ability to return to ADLs by 06/13/2015.    Baseline Unable to lift.                Plan - 05/16/15 0909    Clinical Impression Statement Pt has no pain with low level AROM, is  not having difficulty maintaining posture except with IR pt demonstrates loss of scapular control so PT focused on this.   Pt will benefit from skilled therapeutic intervention in order to improve on the following deficits Pain;Decreased strength;Impaired UE functional use   Rehab Potential Good        Problem List Patient Active Problem List   Diagnosis Date Noted  . BPH with obstruction/lower urinary tract symptoms 04/12/2015  . Erectile dysfunction 04/12/2015  . Hypogonadism in male 04/12/2015  . Family history of early CAD 09/27/2011  . Chest pain 09/27/2011  . Visit for preventive health examination 09/27/2011    Lucille Witts 05/16/2015, 9:11 AM  [facility] [address] [place], [address] Phone: [phone]   Fax:  [phone]

## 2015-05-18 ENCOUNTER — Ambulatory Visit: Payer: BLUE CROSS/BLUE SHIELD | Admitting: Physical Therapy

## 2015-05-18 ENCOUNTER — Encounter: Payer: BLUE CROSS/BLUE SHIELD | Admitting: Physical Therapy

## 2015-05-18 DIAGNOSIS — M12511 Traumatic arthropathy, right shoulder: Secondary | ICD-10-CM | POA: Diagnosis not present

## 2015-05-18 DIAGNOSIS — M25521 Pain in right elbow: Secondary | ICD-10-CM

## 2015-05-18 NOTE — Therapy (Signed)
[facility] [address] [place], [address] Phone: [phone]   Fax:  [phone]  Physical Therapy Treatment  Patient Details  Name: Raymond Flowers MRN: [id number] Date of Birth: Jan 06, 1956 Referring Provider:  Justice Britain, MD  Encounter Date: 05/18/2015      PT End of Session - 05/18/15 1153    Visit Number 14   Number of Visits 17   Date for PT Re-Evaluation 06/13/15   PT Start Time 0930   PT Stop Time 6962   PT Time Calculation (min) 45 min   Activity Tolerance Patient tolerated treatment well   Behavior During Therapy [facility] for tasks assessed/performed      Past Medical History  Diagnosis Date  . Erectile dysfunction   . Blood in semen   . Over weight   . Hypertension   . Benign enlargement of prostate   . Androgen deficiency   . BPH (benign prostatic hyperplasia)   . Impotence     Past Surgical History  Procedure Laterality Date  . Shoulder surgery  2009    right  shoulder x 2  (20016)  . Varicocele excision  8  . Tonsillectomy    . Knee arthroscopy Right 2016  . Tricep surgery Left 2015    There were no vitals filed for this visit.  Visit Diagnosis:  Pain in joint, upper arm, right      Subjective Assessment - 05/18/15 1150    Subjective Pt reports no soreness today, he has continued with HEP as appropriate.   Currently in Pain? No/denies          Objective: Dewey joint mobs to facilitate improved shoulder IR, performed in 0 and 90 degr. Abduction 10x30 grade IV in multiple angles. Following this IR improved from 20 degr. To 70 degr. Pt followed up with towel stretch to maintain this improvement in ROM.  Same performed for flexion, performed in end range for flexion with cuing to extend thoracic spine.  Scapular holds performed with manual resistance in all 4 planes with cuing for avoiding compensatory back motion, pt fatigued at the 1 min mark - look to progress to  2 min holds in all directions to return to more normal functional level.  Reviewed thoracic extension exercise, modified to maintain neutral lumbar spine ( pt was compensating with lumbar) - pt able to perform well with manual cuing.  Look to progress in next session to incr. AROm including abduction and shoulder rotation in all planes. Pt demonstrates poor scapular control with endurance so this too needs to be addressed.                       PT Education - 05/18/15 1150    Education provided Yes   Education Details educated pt on progressing ther ex.    Person(s) Educated Patient   Methods Explanation   Comprehension Verbalized understanding;Returned demonstration          PT Short Term Goals - 04/05/15 1652    PT SHORT TERM GOAL #1   Title Patient will display full PROM with a VAS score of less than 3/10 to demonstrate ability to participate in [facility] for functional activities.            PT Long Term Goals - 04/05/15 1646    PT LONG TERM GOAL #1   Title Patient will be independent with home exercise program to increase his strength, mobility,  and function of RUE by 06/13/2015   Status New   PT LONG TERM GOAL #2   Title Patient will report a QuickDash score of less than 42% to indicate a clinically significant improvement in self reported function by 06/13/2015   Baseline 52.3% disability   Status New   PT LONG TERM GOAL #3   Title Patient will display full active ROM WFL of RUE to demonstrate increased function by 06/13/2015.    Baseline No AROM per protocl.    Status New   PT LONG TERM GOAL #4   Title Patient will participate in recreational hobbies such as hunting and fishing with no increase in symptoms to return to functional activities by 06/13/2015.   Baseline Unable to actively use RUE.    PT LONG TERM GOAL #5   Title Patient will be able to lift at least 10 pounds to demonstrate ability to return to ADLs by 06/13/2015.    Baseline Unable to lift.                 Plan - 05/18/15 1154    Clinical Impression Statement Pt continues to make signficant gains in function per protocol, however he is limited by significant pain in knee. Pt demosntrated scapular endurance deficit and would benefit from continued PT to address this in addition to continuing to work on improving IR and end range flexion.   Pt will benefit from skilled therapeutic intervention in order to improve on the following deficits Pain;Decreased strength;Impaired UE functional use   Rehab Potential Good   PT Treatment/Interventions Manual techniques;Iontophoresis 4mg /ml Dexamethasone;Moist Heat;Therapeutic exercise;Therapeutic activities;Taping;Dry needling;Passive range of motion;Ultrasound;Biofeedback;Electrical Stimulation;Cryotherapy        Problem List Patient Active Problem List   Diagnosis Date Noted  . BPH with obstruction/lower urinary tract symptoms 04/12/2015  . Erectile dysfunction 04/12/2015  . Hypogonadism in male 04/12/2015  . Family history of early CAD 09/27/2011  . Chest pain 09/27/2011  . Visit for preventive health examination 09/27/2011    [name],[name] 05/18/2015, 11:56 AM  [facility] [address], [place], [address] Phone: [phone]   Fax:  [phone]

## 2015-05-20 ENCOUNTER — Encounter: Payer: BLUE CROSS/BLUE SHIELD | Admitting: Physical Therapy

## 2015-05-24 ENCOUNTER — Ambulatory Visit: Payer: BLUE CROSS/BLUE SHIELD | Admitting: Physical Therapy

## 2015-05-24 DIAGNOSIS — M25521 Pain in right elbow: Secondary | ICD-10-CM

## 2015-05-24 DIAGNOSIS — M12511 Traumatic arthropathy, right shoulder: Secondary | ICD-10-CM | POA: Diagnosis not present

## 2015-05-24 NOTE — Therapy (Signed)
[facility] [address], [place], [address] Phone: [phone]   Fax:  [phone]  Physical Therapy Treatment  Patient Details  Name: Raymond Flowers MRN: [id number] Date of Birth: 1956-07-30 Referring Provider:  Justice Britain, MD  Encounter Date: 05/24/2015      PT End of Session - 05/24/15 1206    Visit Number 15   Number of Visits 17   Date for PT Re-Evaluation 06/13/15   PT Start Time 0945   PT Stop Time 1025   PT Time Calculation (min) 40 min   Activity Tolerance Patient tolerated treatment well   Behavior During Therapy [facility] for tasks assessed/performed      Past Medical History  Diagnosis Date  . Erectile dysfunction   . Blood in semen   . Over weight   . Hypertension   . Benign enlargement of prostate   . Androgen deficiency   . BPH (benign prostatic hyperplasia)   . Impotence     Past Surgical History  Procedure Laterality Date  . Shoulder surgery  2009    right  shoulder x 2  ([address]16)  . Varicocele excision  8  . Tonsillectomy    . Knee arthroscopy Right 2016  . Tricep surgery Left 2015    There were no vitals filed for this visit.  Visit Diagnosis:  Pain in joint, upper arm, right      Subjective Assessment - 05/24/15 0942    Subjective Pt reports he had a "bad night" last night due to his knee pain. Shoulder has moderate soreness.    Currently in Pain? No/denies   Pain Score 0-No pain           Objective: Ball rolling on incline surface x3 min.  Ball rolling on flat surface with wted ball x3 min.  Bent arm shoulder AROM with cuing for scapular control, scapular assist, scapular resist, 20 min total, pain free following prep work for improved scapular control.  Pt reports decr. Soreness following exercises.  GH joint mobs 3x1 min inferior glide at end range flexion, 90 degr. Abduction, STM performed on posterior capsule.  Following this pt reported 0/10  and no stiffness. PT educated pt on avoiding use of UE if he is unable to control his shoulder and spine. Pt verbaliezd understanding.                      PT Education - 05/24/15 0954    Education provided Yes   Education Details modified HEP to address mild stiffness. encouraged pt to be more careful and try to use UE less.   Person(s) Educated Patient   Methods Explanation   Comprehension Verbalized understanding          PT Short Term Goals - 04/05/15 1652    PT SHORT TERM GOAL #1   Title Patient will display full PROM with a VAS score of less than 3/10 to demonstrate ability to participate in [facility] for functional activities.            PT Long Term Goals - 04/05/15 1646    PT LONG TERM GOAL #1   Title Patient will be independent with home exercise program to increase his strength, mobility, and function of RUE by 06/13/2015   Status New   PT LONG TERM GOAL #2   Title Patient will report a QuickDash score of less than 42% to indicate a clinically significant improvement in  self reported function by 06/13/2015   Baseline 52.3% disability   Status New   PT LONG TERM GOAL #3   Title Patient will display full active ROM WFL of RUE to demonstrate increased function by 06/13/2015.    Baseline No AROM per protocl.    Status New   PT LONG TERM GOAL #4   Title Patient will participate in recreational hobbies such as hunting and fishing with no increase in symptoms to return to functional activities by 06/13/2015.   Baseline Unable to actively use RUE.    PT LONG TERM GOAL #5   Title Patient will be able to lift at least 10 pounds to demonstrate ability to return to ADLs by 06/13/2015.    Baseline Unable to lift.                Plan - 05/24/15 1206    Clinical Impression Statement Limiting motion due to incr. soreness in shoulder which may be related to pt going fishing over the weekend. PT enocuraged pt to follow protocol and avoid resisted active use of UE.  Limiting pt to 90 degrees or lower for motion and focus of session and HEP on improving scapular control. IWith cuing to control scapula pt able to perform pain free exercises, without cuing reproduced incr. stiffness.   Pt will benefit from skilled therapeutic intervention in order to improve on the following deficits Pain;Decreased strength;Impaired UE functional use   Rehab Potential Good   PT Frequency 2x / week   PT Duration 8 weeks   PT Treatment/Interventions Manual techniques;Iontophoresis 4mg /ml Dexamethasone;Moist Heat;Therapeutic exercise;Therapeutic activities;Taping;Dry needling;Passive range of motion;Ultrasound;Biofeedback;Electrical Stimulation;Cryotherapy        Problem List Patient Active Problem List   Diagnosis Date Noted  . BPH with obstruction/lower urinary tract symptoms 04/12/2015  . Erectile dysfunction 04/12/2015  . Hypogonadism in male 04/12/2015  . Family history of early CAD 09/27/2011  . Chest pain 09/27/2011  . Visit for preventive health examination 09/27/2011    [name],[name] 05/24/2015, 12:11 PM  [facility] [address] [place], [address] Phone: [phone]   Fax:  [phone]

## 2015-05-27 ENCOUNTER — Ambulatory Visit: Payer: BLUE CROSS/BLUE SHIELD | Attending: Orthopedic Surgery | Admitting: Physical Therapy

## 2015-05-27 DIAGNOSIS — M79621 Pain in right upper arm: Secondary | ICD-10-CM | POA: Insufficient documentation

## 2015-05-27 DIAGNOSIS — M12511 Traumatic arthropathy, right shoulder: Secondary | ICD-10-CM | POA: Insufficient documentation

## 2015-05-27 DIAGNOSIS — M6281 Muscle weakness (generalized): Secondary | ICD-10-CM | POA: Diagnosis present

## 2015-05-27 DIAGNOSIS — M25521 Pain in right elbow: Secondary | ICD-10-CM

## 2015-05-27 NOTE — Therapy (Signed)
[facility] [address] [place], [address] Phone: [phone]   Fax:  [phone]  Physical Therapy Treatment  Patient Details  Name: Raymond Flowers MRN: [id number] Date of Birth: 1956/03/12 Referring Provider:  Justice Britain, MD  Encounter Date: 05/27/2015      PT End of Session - 05/27/15 1012    Visit Number 16   Number of Visits 17   Date for PT Re-Evaluation 06/13/15   PT Start Time 0930   PT Stop Time 0955   PT Time Calculation (min) 25 min   Activity Tolerance Patient tolerated treatment well   Behavior During Therapy [facility] for tasks assessed/performed      Past Medical History  Diagnosis Date  . Erectile dysfunction   . Blood in semen   . Over weight   . Hypertension   . Benign enlargement of prostate   . Androgen deficiency   . BPH (benign prostatic hyperplasia)   . Impotence     Past Surgical History  Procedure Laterality Date  . Shoulder surgery  2009    right  shoulder x 2  (20016)  . Varicocele excision  8  . Tonsillectomy    . Knee arthroscopy Right 2016  . Tricep surgery Left 2015    There were no vitals filed for this visit.  Visit Diagnosis:  Pain in joint, upper arm, right      Subjective Assessment - 05/27/15 1010    Subjective Pt has been seen by MD who is pleased with his progress and encouraged pt to "stay the course". Reports minimal stiffness currently.   Currently in Pain? No/denies   Pain Score 0-No pain                Objective: Progressed to black theraband for rows, green theraband for low row, yellow theraband for ER/IR. Pt required mirror and verbal cuing for ER/IR. Manual correction of scapula with low row. Row performed well with no cuing required.  Attempted lean back press from wall to address IR strength, unable to perform well so performed in supine 3x10 with YTB, standing 3x10 with YTB.   AROM for scaption and  abduction.  Overall pt is requiring decr. Cuing and will be appropriate for dropping down to 1x per week following this session. Pt has approved this plan.                 PT Education - 05/27/15 1010    Education provided Yes   Education Details educated pt on new HEP.   Person(s) Educated Patient   Methods Explanation   Comprehension Verbalized understanding          PT Short Term Goals - 04/05/15 1652    PT SHORT TERM GOAL #1   Title Patient will display full PROM with a VAS score of less than 3/10 to demonstrate ability to participate in [facility] for functional activities.            PT Long Term Goals - 04/05/15 1646    PT LONG TERM GOAL #1   Title Patient will be independent with home exercise program to increase his strength, mobility, and function of RUE by 06/13/2015   Status New   PT LONG TERM GOAL #2   Title Patient will report a QuickDash score of less than 42% to indicate a clinically significant improvement in self reported function by 06/13/2015   Baseline 52.3% disability   Status New  PT LONG TERM GOAL #3   Title Patient will display full active ROM WFL of RUE to demonstrate increased function by 06/13/2015.    Baseline No AROM per protocl.    Status New   PT LONG TERM GOAL #4   Title Patient will participate in recreational hobbies such as hunting and fishing with no increase in symptoms to return to functional activities by 06/13/2015.   Baseline Unable to actively use RUE.    PT LONG TERM GOAL #5   Title Patient will be able to lift at least 10 pounds to demonstrate ability to return to ADLs by 06/13/2015.    Baseline Unable to lift.                Plan - 05/27/15 1012    Clinical Impression Statement Progressed AROM exercises to incr. challenge, added very light resistance to ER and IR. Pt continues to make progress however is demonstrating incr. upper thoracic kyphosis with shoulder extension and IR which he is able to correct with  cuing.   Pt will benefit from skilled therapeutic intervention in order to improve on the following deficits Pain;Decreased strength;Impaired UE functional use        Problem List Patient Active Problem List   Diagnosis Date Noted  . BPH with obstruction/lower urinary tract symptoms 04/12/2015  . Erectile dysfunction 04/12/2015  . Hypogonadism in male 04/12/2015  . Family history of early CAD 09/27/2011  . Chest pain 09/27/2011  . Visit for preventive health examination 09/27/2011    [name],[name] 05/27/2015, 10:14 AM  [facility] [address], [place], [address] Phone: [phone]   Fax:  [phone]

## 2015-05-27 NOTE — Patient Instructions (Signed)
progresssed to black theraband for row, green theraband for low row. Yellow theraband ER in mirror to address poor motor control Yellow theraband for IR in supine 3x10, in standing 3x10 Scaption, abduction AROM pain free 3x20 Manual cuing for performance with neutral spine, scapular control.

## 2015-06-01 ENCOUNTER — Encounter: Payer: BLUE CROSS/BLUE SHIELD | Admitting: Physical Therapy

## 2015-06-02 ENCOUNTER — Ambulatory Visit: Payer: BLUE CROSS/BLUE SHIELD | Admitting: Physical Therapy

## 2015-06-02 DIAGNOSIS — M25521 Pain in right elbow: Secondary | ICD-10-CM

## 2015-06-02 DIAGNOSIS — M79621 Pain in right upper arm: Secondary | ICD-10-CM | POA: Diagnosis not present

## 2015-06-02 NOTE — Therapy (Signed)
[facility] [address] [place], [address] Phone: [phone]   Fax:  [phone]  Physical Therapy Treatment  Patient Details  Name: Raymond Flowers MRN: [id number] Date of Birth: 02-01-1956 Referring Provider:  Justice Britain, MD  Encounter Date: 06/02/2015      PT End of Session - 06/02/15 1246    Visit Number 17   Number of Visits 17   Date for PT Re-Evaluation 06/13/15   PT Start Time 1030   PT Stop Time 1110   PT Time Calculation (min) 40 min   Activity Tolerance Patient tolerated treatment well   Behavior During Therapy [facility] for tasks assessed/performed      Past Medical History  Diagnosis Date  . Erectile dysfunction   . Blood in semen   . Over weight   . Hypertension   . Benign enlargement of prostate   . Androgen deficiency   . BPH (benign prostatic hyperplasia)   . Impotence     Past Surgical History  Procedure Laterality Date  . Shoulder surgery  2009    right  shoulder x 2  (20016)  . Varicocele excision  8  . Tonsillectomy    . Knee arthroscopy Right 2016  . Tricep surgery Left 2015    There were no vitals filed for this visit.  Visit Diagnosis:  Pain in joint, upper arm, right      Subjective Assessment - 06/02/15 1035    Subjective Pt reports he is having moderate pain routinely currently. He has noticed it is worst at night. Pt is taking gabapentin now to help with tihs. Pt reports decr.ability to sleep through the night.   Patient Stated Goals To return to his outdoor hobbies (hunting and fishing).    Currently in Pain? Yes   Pain Score 2    Pain Location Arm   Pain Orientation Right            Objective: Modifying sleeping position, attempted multiple positions - best available is with large pillow for "hug pillow" with cuing to avoid protraction of shoulder.  Modified HEP to one set of 3x10 for all AROM instead of what pt was performing on her own, 3  sets of 3x10.  Extensive STM performed on painful deltoid, pec minor, supraspinatus.  Bladen joint mobs in seated with arm at side 3x30 grade II focusing on pain relief.  Following this pt reported 0/10 pain with shoulder motion, prior to manual tx pain of 3/10 with overhead ROM exercises.                    PT Education - 06/02/15 1245    Education provided Yes   Education Details educated pt on decr. HEP to decr. pain   Person(s) Educated Patient   Methods Explanation   Comprehension Verbalized understanding          PT Short Term Goals - 04/05/15 1652    PT SHORT TERM GOAL #1   Title Patient will display full PROM with a VAS score of less than 3/10 to demonstrate ability to participate in [facility] for functional activities.            PT Long Term Goals - 04/05/15 1646    PT LONG TERM GOAL #1   Title Patient will be independent with home exercise program to increase his strength, mobility, and function of RUE by 06/13/2015   Status New   PT LONG  TERM GOAL #2   Title Patient will report a QuickDash score of less than 42% to indicate a clinically significant improvement in self reported function by 06/13/2015   Baseline 52.3% disability   Status New   PT LONG TERM GOAL #3   Title Patient will display full active ROM WFL of RUE to demonstrate increased function by 06/13/2015.    Baseline No AROM per protocl.    Status New   PT LONG TERM GOAL #4   Title Patient will participate in recreational hobbies such as hunting and fishing with no increase in symptoms to return to functional activities by 06/13/2015.   Baseline Unable to actively use RUE.    PT LONG TERM GOAL #5   Title Patient will be able to lift at least 10 pounds to demonstrate ability to return to ADLs by 06/13/2015.    Baseline Unable to lift.                Plan - 06/02/15 1247    Clinical Impression Statement focus of session on reducing pain and educating pt on avoiding excessive activity as  pt is c/o moderate pain and difficulty sleeping. Also educated pt and tried multiple positions for sleeping - pt will try larger pillow for positioning of shoulder.   Pt will benefit from skilled therapeutic intervention in order to improve on the following deficits Pain;Decreased strength;Impaired UE functional use   Rehab Potential Good   PT Frequency 2x / week   PT Duration 8 weeks   PT Treatment/Interventions Manual techniques;Iontophoresis 4mg /ml Dexamethasone;Moist Heat;Therapeutic exercise;Therapeutic activities;Taping;Dry needling;Passive range of motion;Ultrasound;Biofeedback;Electrical Stimulation;Cryotherapy        Problem List Patient Active Problem List   Diagnosis Date Noted  . BPH with obstruction/lower urinary tract symptoms 04/12/2015  . Erectile dysfunction 04/12/2015  . Hypogonadism in male 04/12/2015  . Family history of early CAD 09/27/2011  . Chest pain 09/27/2011  . Visit for preventive health examination 09/27/2011    Raymond Flowers 06/02/2015, 12:51 PM  [facility] [address], [place], [address] Phone: [phone]   Fax:  [phone]

## 2015-06-07 ENCOUNTER — Ambulatory Visit: Payer: BLUE CROSS/BLUE SHIELD | Admitting: Physical Therapy

## 2015-06-09 ENCOUNTER — Ambulatory Visit: Payer: BLUE CROSS/BLUE SHIELD | Admitting: Physical Therapy

## 2015-06-09 DIAGNOSIS — M12811 Other specific arthropathies, not elsewhere classified, right shoulder: Secondary | ICD-10-CM

## 2015-06-09 DIAGNOSIS — M6281 Muscle weakness (generalized): Secondary | ICD-10-CM

## 2015-06-09 DIAGNOSIS — M79621 Pain in right upper arm: Secondary | ICD-10-CM | POA: Diagnosis not present

## 2015-06-09 NOTE — Therapy (Signed)
[facility] [address] [place], [address] Phone: [phone]   Fax:  [phone]  Physical Therapy Treatment  Patient Details  Name: Raymond Flowers MRN: [id number] Date of Birth: [birth year] Referring Provider:  Justice Britain, MD  Encounter Date: 06/09/2015      PT End of Session - 06/09/15 1028    Visit Number 18   Number of Visits 24   Date for PT Re-Evaluation 07/12/15   PT Start Time 0945   PT Stop Time 1028   PT Time Calculation (min) 43 min   Activity Tolerance Patient tolerated treatment well   Behavior During Therapy [facility] for tasks assessed/performed      Past Medical History  Diagnosis Date  . Erectile dysfunction   . Blood in semen   . Over weight   . Hypertension   . Benign enlargement of prostate   . Androgen deficiency   . BPH (benign prostatic hyperplasia)   . Impotence     Past Surgical History  Procedure Laterality Date  . Shoulder surgery  2009    right  shoulder x 2  (20016)  . Varicocele excision  8  . Tonsillectomy    . Knee arthroscopy Right 2016  . Tricep surgery Left 2015    There were no vitals filed for this visit.  Visit Diagnosis:  Rotator cuff arthropathy, right - Plan: PT plan of care cert/re-cert  Muscle weakness - Plan: PT plan of care cert/re-cert      Subjective Assessment - 06/09/15 0947    Subjective Pt reports he is continuing to do his eercises and is having minimal to no pain. He is able to wash his back and is having no difficulty.   Currently in Pain? No/denies   Pain Score 0-No pain                 Objective: 2#, 5# bicep curl to shoulder press with cuing for form and improved control, as well as to avoid pian.  Same with 2# for bent arm lateral fly, all performed 3x10.  OMEGA scap row and lat pull down 3x10 with 15# wt.  Body blade 3x1 min forward, down.   SL shoulder external rotaion with towel 3x20.  Pt fatigued  throughout session with exercises and would require rest break and cuing to improve performance of pain free motion. PT educated pt on avoiding pain with exercise.                PT Education - 06/09/15 1028    Education provided Yes   Education Details positioning for ER exercise, SL ER exercise, progression to dumbbell exercises.   Person(s) Educated Patient   Methods Explanation   Comprehension Verbalized understanding          PT Short Term Goals - 06/09/15 1030    PT SHORT TERM GOAL #1   Title Patient will display full PROM with a VAS score of less than 3/10 to demonstrate ability to participate in [facility] for functional activities.    Status Achieved           PT Long Term Goals - 06/09/15 1030    PT LONG TERM GOAL #1   Title Patient will be independent with home exercise program to increase his strength, mobility, and function of RUE by 06/13/2015   Status Achieved   PT LONG TERM GOAL #2   Title Patient will report a QuickDash score of less  than 42% to indicate a clinically significant improvement in self reported function by 06/13/2015   Time 4   Period Weeks   Status Deferred   PT LONG TERM GOAL #3   Title Patient will display full active ROM WFL of RUE to demonstrate increased function by 06/13/2015.    Time 4   Period Weeks   Status Achieved   PT LONG TERM GOAL #4   Title Patient will participate in recreational hobbies such as hunting and fishing with no increase in symptoms to return to functional activities by 06/13/2015.   Time 4   Status Partially Met   PT LONG TERM GOAL #5   Title Patient will be able to lift at least 10 pounds to demonstrate ability to return to ADLs by 06/13/2015.    Time 4   Period Weeks   Status Achieved               Plan - 06/09/15 1029    Clinical Impression Statement Pt has made significant improvement in strength and ROM is now WNL. Pt may be appropriate for d/c at next session although he does have focal  weakness in R external rotators.   Pt will benefit from skilled therapeutic intervention in order to improve on the following deficits Pain;Decreased strength;Impaired UE functional use   Rehab Potential Good   Clinical Impairments Affecting Rehab Potential Second surgery on his RUE with a history of trauma to the area.    PT Frequency 2x / week   PT Duration 8 weeks   PT Treatment/Interventions Manual techniques;Iontophoresis 55m/ml Dexamethasone;Moist Heat;Therapeutic exercise;Therapeutic activities;Taping;Dry needling;Passive range of motion;Ultrasound;Biofeedback;Electrical Stimulation;Cryotherapy        Problem List Patient Active Problem List   Diagnosis Date Noted  . BPH with obstruction/lower urinary tract symptoms 04/12/2015  . Erectile dysfunction 04/12/2015  . Hypogonadism in male 04/12/2015  . Family history of early CAD 09/27/2011  . Chest pain 09/27/2011  . Visit for preventive health examination 09/27/2011    [name],[name] 06/09/2015, 11:13 AM  [facility] [address] [place] [address]Phone: [phone]  Fax:  [phone]

## 2015-06-10 ENCOUNTER — Other Ambulatory Visit: Payer: Self-pay

## 2015-06-10 DIAGNOSIS — E291 Testicular hypofunction: Secondary | ICD-10-CM

## 2015-06-10 MED ORDER — TESTOSTERONE 30 MG/ACT TD SOLN: TRANSDERMAL | Status: DC

## 2015-06-10 NOTE — Progress Notes (Signed)
Pharmacy sent a refill request. Per Larene [place] medication could be refilled.

## 2015-06-12 ENCOUNTER — Telehealth: Payer: Self-pay | Admitting: Urology

## 2015-06-12 NOTE — Telephone Encounter (Signed)
Patient's pharmacy contacted [place] through [employer] for a refill on his [place].  Would you confirm this with the patient?

## 2015-06-13 ENCOUNTER — Ambulatory Visit: Payer: BLUE CROSS/BLUE SHIELD | Admitting: Physical Therapy

## 2015-06-13 NOTE — Telephone Encounter (Signed)
LMOM

## 2015-06-14 NOTE — Telephone Encounter (Signed)
LMOM

## 2015-06-15 NOTE — Telephone Encounter (Signed)
Pt returned the phone call. Pt stated he has already picked up his medication. No need to refill it again.

## 2015-06-16 ENCOUNTER — Encounter: Payer: BLUE CROSS/BLUE SHIELD | Admitting: Physical Therapy

## 2015-06-20 ENCOUNTER — Ambulatory Visit: Payer: BLUE CROSS/BLUE SHIELD | Admitting: Physical Therapy

## 2015-06-20 DIAGNOSIS — M6281 Muscle weakness (generalized): Secondary | ICD-10-CM

## 2015-06-20 DIAGNOSIS — M79621 Pain in right upper arm: Secondary | ICD-10-CM | POA: Diagnosis not present

## 2015-06-20 NOTE — Therapy (Signed)
[facility] [address], [place], [address] Phone: [phone]   Fax:  [phone]  Physical Therapy Treatment  Patient Details  Name: Raymond Flowers MRN: [id number] Date of Birth: May 09, 1956 Referring Provider:  Justice Britain, MD  Encounter Date: 06/20/2015      PT End of Session - 06/20/15 1001    Visit Number 19   Number of Visits 24   Date for PT Re-Evaluation 07/12/15   PT Start Time 0945   PT Stop Time 1025   PT Time Calculation (min) 40 min   Activity Tolerance --   Behavior During Therapy --      Past Medical History  Diagnosis Date  . Erectile dysfunction   . Blood in semen   . Over weight   . Hypertension   . Benign enlargement of prostate   . Androgen deficiency   . BPH (benign prostatic hyperplasia)   . Impotence     Past Surgical History  Procedure Laterality Date  . Shoulder surgery  2009    right  shoulder x 2  (20016)  . Varicocele excision  8  . Tonsillectomy    . Knee arthroscopy Right 2016  . Tricep surgery Left 2015    There were no vitals filed for this visit.  Visit Diagnosis:  Muscle weakness      Subjective Assessment - 06/20/15 0944    Subjective Pt reports he went on a short vacation and was able to participate in all activity including return to gym for strengthening.   Pertinent History When patient was 59 years old he sustained a serious injury which injured his scapulae and his pectoral muscle on his RUE. He rehabbed this himself and felt good until he began feeling a cutting sensation in his RUE (which is when he had the acromion resected). Most recently he was lifting weights and while doing a combined shoulder press to eccentrically controlling a lateral raise he felt a snapping sensation in his RUE and underwent this procedure.    Limitations Lifting   How long can you sit comfortably? Patient works at [employer].    Patient Stated Goals To return to  his outdoor hobbies (hunting and fishing).    Currently in Pain? No/denies             Objective: Issued two progressions, one for "loosening" prior to working out or engaging in physical activity, one for taking impact.  Loosening: 3x10 slow reps of: Cervical lateral flexion Cervical rotation Cervical circles Shoulder circles both directions Elbow circles both directions Wrist circles both directions  All with cuing for full ROM and to be performed slowlyand pain free.  Impact: x1 min of: Bean bag slap at neutral elevation Progressing to (via education) harder slap, then clapping hands, then use of hammer.  PT also answered multiple questions regarding return to exercises.                     PT Education - 06/20/15 0959    Education provided Yes   Education Details Educated on routine for warmup prior to exercises, Encouraged pt to progress to pushing exercises.   Person(s) Educated Patient   Methods Explanation   Comprehension Verbalized understanding          PT Short Term Goals - 06/09/15 1030    PT SHORT TERM GOAL #1   Title Patient will display full PROM with a VAS score of  less than 3/10 to demonstrate ability to participate in [facility] for functional activities.    Status Achieved           PT Long Term Goals - 06/20/15 1026    PT LONG TERM GOAL #1   Title Patient will be independent with home exercise program to increase his strength, mobility, and function of RUE by 06/13/2015   Status Achieved   PT LONG TERM GOAL #2   Title Patient will report a QuickDash score of less than 42% to indicate a clinically significant improvement in self reported function by 06/13/2015   Time 4   Period Weeks   Status Achieved   PT LONG TERM GOAL #3   Title Patient will display full active ROM WFL of RUE to demonstrate increased function by 06/13/2015.    Time 4   Period Weeks   Status Achieved   PT LONG TERM GOAL #4   Title Patient will participate  in recreational hobbies such as hunting and fishing with no increase in symptoms to return to functional activities by 06/13/2015.   Time 4   Status Achieved   PT LONG TERM GOAL #5   Title Patient will be able to lift at least 10 pounds to demonstrate ability to return to ADLs by 06/13/2015.    Time 4   Period Weeks   Status Achieved               Plan - 06/20/15 1025    Clinical Impression Statement Pt is now appropriate for d/c, he has met all goals and is able to strengthen on his own. Pt has made exceptional progress in all aspects of rehab and is confident in return to exercise and resumption of more typical activities. PT encouraged pt to progress slowly with impact on shoulder.   Pt will benefit from skilled therapeutic intervention in order to improve on the following deficits Pain;Decreased strength;Impaired UE functional use   Rehab Potential Good   Clinical Impairments Affecting Rehab Potential Second surgery on his RUE with a history of trauma to the area.    PT Frequency 2x / week   PT Duration 8 weeks   PT Treatment/Interventions Manual techniques;Iontophoresis 66m/ml Dexamethasone;Moist Heat;Therapeutic exercise;Therapeutic activities;Taping;Dry needling;Passive range of motion;Ultrasound;Biofeedback;Electrical Stimulation;Cryotherapy        Problem List Patient Active Problem List   Diagnosis Date Noted  . BPH with obstruction/lower urinary tract symptoms 04/12/2015  . Erectile dysfunction 04/12/2015  . Hypogonadism in male 04/12/2015  . Family history of early CAD 09/27/2011  . Chest pain 09/27/2011  . Visit for preventive health examination 09/27/2011    [name],[name] 06/20/2015, 10:27 AM  [facility] [address] [place] [address]Phone: [phone]  Fax:  [phone]

## 2015-06-23 ENCOUNTER — Ambulatory Visit: Payer: BLUE CROSS/BLUE SHIELD | Admitting: Physical Therapy

## 2015-06-23 DIAGNOSIS — M12811 Other specific arthropathies, not elsewhere classified, right shoulder: Secondary | ICD-10-CM

## 2015-06-23 NOTE — Therapy (Signed)
[place] [facility] [address], [place], [address] Phone: [phone]   Fax:  [phone]  Physical Therapy Discharge  Patient Details  Name: Raymond Flowers MRN: [id number] Date of Birth: [birth year] Referring Provider:  Justice Britain, MD  Encounter Date: 06/23/2015   Discharge summary:  Pt is now appropriate for d/c, he has met all goals and is able to strengthen on his own. Pt has made exceptional progress in all aspects of rehab and is confident in return to exercise and resumption of more typical activities. PT encouraged pt to progress slowly with impact on shoulder. PT also educated pt on use of crutches to assist with upcoming knee surgery.      Past Medical History  Diagnosis Date  . Erectile dysfunction   . Blood in semen   . Over weight   . Hypertension   . Benign enlargement of prostate   . Androgen deficiency   . BPH (benign prostatic hyperplasia)   . Impotence     Past Surgical History  Procedure Laterality Date  . Shoulder surgery  2009    right  shoulder x 2  (20016)  . Varicocele excision  8  . Tonsillectomy    . Knee arthroscopy Right 2016  . Tricep surgery Left 2015    There were no vitals filed for this visit.  Visit Diagnosis:  Rotator cuff arthropathy, right      Subjective Assessment - 06/23/15 0956    Subjective Pt only stopped in for 5 min to ensure he knows how to safely use crutches, requested to end quickly so no charges filed.    Currently in Pain? No/denies                                 PT Education - 06/23/15 0957    Education provided Yes   Education Details Educated pt on use of crutches safely with stairs, curb and on flat surface for PWB and WBAT as well as NWB   Person(s) Educated Patient   Methods Explanation   Comprehension Verbalized understanding          PT Short Term Goals - 06/09/15 1030    PT SHORT TERM GOAL #1    Title Patient will display full PROM with a VAS score of less than 3/10 to demonstrate ability to participate in [facility] for functional activities.    Status Achieved           PT Long Term Goals - 06/23/15 0959    PT LONG TERM GOAL #1   Title Patient will be independent with home exercise program to increase his strength, mobility, and function of RUE by 06/13/2015   Status Achieved   PT LONG TERM GOAL #2   Title Patient will report a QuickDash score of less than 42% to indicate a clinically significant improvement in self reported function by 06/13/2015   Time 4   Period Weeks   Status Achieved   PT LONG TERM GOAL #3   Title Patient will display full active ROM WFL of RUE to demonstrate increased function by 06/13/2015.    Time 4   Period Weeks   Status Achieved   PT LONG TERM GOAL #4   Title Patient will participate in recreational hobbies such as hunting and fishing with no increase in symptoms to return to functional activities by 06/13/2015.   Time 4  Status Achieved   PT LONG TERM GOAL #5   Title Patient will be able to lift at least 10 pounds to demonstrate ability to return to ADLs by 06/13/2015.    Time 4   Period Weeks   Status Achieved               Plan - 06/23/15 8811    Clinical Impression Statement Pt is now appropriate for d/c, he has met all goals and is able to strengthen on his own. Pt has made exceptional progress in all aspects of rehab and is confident in return to exercise and resumption of more typical activities. PT encouraged pt to progress slowly with impact on shoulder.        Problem List Patient Active Problem List   Diagnosis Date Noted  . BPH with obstruction/lower urinary tract symptoms 04/12/2015  . Erectile dysfunction 04/12/2015  . Hypogonadism in male 04/12/2015  . Family history of early CAD 09/27/2011  . Chest pain 09/27/2011  . Visit for preventive health examination 09/27/2011    [name],[name] 06/23/2015, 10:02  AM  [place] [facility] [address] [place], [address] Phone: [phone]   Fax:  [phone]

## 2015-06-27 ENCOUNTER — Ambulatory Visit: Payer: BLUE CROSS/BLUE SHIELD | Admitting: Physical Therapy

## 2015-08-24 ENCOUNTER — Telehealth: Payer: Self-pay

## 2015-08-24 DIAGNOSIS — E291 Testicular hypofunction: Secondary | ICD-10-CM

## 2015-08-24 NOTE — Telephone Encounter (Signed)
Pt pharmacy sent a refill request for axiron. I saw September we refilled it. Please advise.

## 2015-08-24 NOTE — Telephone Encounter (Signed)
That is fine, but he has an upcoming appointment in January.  He is schedule for a PSA on 10/13/2015.  He will also need a testosterone, TSH, HCT, lipids and a hepatic panel added to that lab visit.

## 2015-08-25 MED ORDER — TESTOSTERONE 30 MG/ACT TD SOLN: TRANSDERMAL | Status: DC

## 2015-08-25 NOTE — Telephone Encounter (Signed)
Labs entered. Medication sent to pharmacy.

## 2015-08-31 ENCOUNTER — Telehealth: Payer: Self-pay | Admitting: Urology

## 2015-08-31 DIAGNOSIS — R972 Elevated prostate specific antigen [PSA]: Secondary | ICD-10-CM

## 2015-08-31 NOTE — Telephone Encounter (Signed)
Patient needs a PSA added to his blood work on 10/13/2015.

## 2015-09-01 NOTE — Telephone Encounter (Signed)
Orders placed.

## 2015-10-13 ENCOUNTER — Other Ambulatory Visit: Payer: BLUE CROSS/BLUE SHIELD

## 2015-10-13 DIAGNOSIS — R972 Elevated prostate specific antigen [PSA]: Secondary | ICD-10-CM

## 2015-10-13 DIAGNOSIS — E291 Testicular hypofunction: Secondary | ICD-10-CM

## 2015-10-14 LAB — HEMATOCRIT: Hematocrit: 48 % (ref 37.5–51.0)

## 2015-10-14 LAB — LIPID PANEL
CHOLESTEROL TOTAL: 148 mg/dL (ref 100–199)
Chol/HDL Ratio: 6.2 ratio units — ABNORMAL HIGH (ref 0.0–5.0)
HDL: 24 mg/dL — ABNORMAL LOW (ref >39)
LDL CALC: 92 mg/dL (ref 0–99)
TRIGLYCERIDES: 160 mg/dL — AB (ref 0–149)
VLDL CHOLESTEROL CAL: 32 mg/dL (ref 5–40)

## 2015-10-14 LAB — HEPATIC FUNCTION PANEL
ALBUMIN: 4.3 g/dL (ref 3.6–4.8)
ALT: 45 IU/L — AB (ref 0–44)
AST: 32 IU/L (ref 0–40)
Alkaline Phosphatase: 52 IU/L (ref 39–117)
BILIRUBIN TOTAL: 0.6 mg/dL (ref 0.0–1.2)
BILIRUBIN, DIRECT: 0.15 mg/dL (ref 0.00–0.40)
Total Protein: 7 g/dL (ref 6.0–8.5)

## 2015-10-14 LAB — TESTOSTERONE: Testosterone: 153 ng/dL — ABNORMAL LOW (ref 348–1197)

## 2015-10-14 LAB — TSH: TSH: 1.38 u[IU]/mL (ref 0.450–4.500)

## 2015-10-14 LAB — PSA: PROSTATE SPECIFIC AG, SERUM: 1.4 ng/mL (ref 0.0–4.0)

## 2015-10-17 ENCOUNTER — Ambulatory Visit (INDEPENDENT_AMBULATORY_CARE_PROVIDER_SITE_OTHER): Payer: BLUE CROSS/BLUE SHIELD | Admitting: Urology

## 2015-10-17 ENCOUNTER — Encounter: Payer: Self-pay | Admitting: Urology

## 2015-10-17 VITALS — BP 160/88 | HR 73 | Ht 69.0 in | Wt 223.2 lb

## 2015-10-17 DIAGNOSIS — N529 Male erectile dysfunction, unspecified: Secondary | ICD-10-CM | POA: Diagnosis not present

## 2015-10-17 DIAGNOSIS — N138 Other obstructive and reflux uropathy: Secondary | ICD-10-CM

## 2015-10-17 DIAGNOSIS — N401 Enlarged prostate with lower urinary tract symptoms: Secondary | ICD-10-CM

## 2015-10-17 DIAGNOSIS — E291 Testicular hypofunction: Secondary | ICD-10-CM | POA: Diagnosis not present

## 2015-10-17 MED ORDER — CIALIS 20 MG PO TABS: 20.0000 mg | ORAL_TABLET | Freq: Every day | ORAL | Status: DC | PRN

## 2015-10-17 MED ORDER — TESTOSTERONE 30 MG/ACT TD SOLN: TRANSDERMAL | Status: DC

## 2015-10-17 NOTE — Progress Notes (Signed)
9:05 AM   Raymond Flowers Dec 30, 1955 [id number]  Referring provider: Rusty Aus, MD [facility] [place], [place] [address]  Chief Complaint  Patient presents with  . Benign Prostatic Hypertrophy    6 month recheck  . Hypogonadism    HPI: [name] is a 60 year old white male with erectile dysfunction, hypogonadism and BPH with LUTS who presents today for 6 month follow-up.  Hypogonadism He is currently using Axiron one swipe 4 times weekly.   He has filled out and ADAM questionnaire today.  He is starting to notice a reduction in spontaneous erections.  He is also having a decrease libido, a decrease in strength and falling asleep after dinner.        Androgen Deficiency in the Aging Male      10/17/15 0800       Androgen Deficiency in the Aging Male   Do you have a decrease in libido (sex drive) Yes     Do you have lack of energy No     Do you have a decrease in strength and/or endurance Yes     Have you lost height No     Have you noticed a decreased "enjoyment of life" No     Are you sad and/or grumpy No     Are your erections less strong Yes     Have you noticed a recent deterioration in your ability to play sports No     Are you falling asleep after dinner Yes     Has there been a recent deterioration in your work performance No        Erectile dysfunction Patient's SHIM score is 20 , which is mild erectile dysfunction.  He is currently managing his erectile dysfunction with Cialis 20 mg on-demand dosing.  He denies any penile curvature or pain with erections, but has been experiencing a loss of erections during intercourse.  He is anxious that this may be a sign of increasing ED.  He and his wife have a good sex life, having intercourse several times during the week.  He does not want to lose that lifestyle.        SHIM      10/17/15 0842       SHIM: Over the last 6 months:   How do you rate your  confidence that you could get and keep an erection? Moderate     When you had erections with sexual stimulation, how often were your erections hard enough for penetration (entering your partner)? Most Times (much more than half the time)     During sexual intercourse, how often were you able to maintain your erection after you had penetrated (entered) your partner? Slightly Difficult     During sexual intercourse, how difficult was it to maintain your erection to completion of intercourse? Slightly Difficult     When you attempted sexual intercourse, how often was it satisfactory for you? Not Difficult     SHIM Total Score   SHIM 20        Score: 1-7 Severe ED 8-11 Moderate ED 12-16 Mild-Moderate ED 17-21 Mild ED 22-25 No ED  BPH with LUTS His IPSS score today is 3, which is mild lower urinary tract symptomatology. He is pleased with delighted of life due to his urinary symptoms.  He is not experiencing dysuria, gross hematuria or suprapubic pain. He also denies any recent fevers, chills, nausea, vomiting or urinary tract  infections.      IPSS      10/17/15 0800       International Prostate Symptom Score   How often have you had the sensation of not emptying your bladder? Not at All     How often have you had to urinate less than every two hours? Less than 1 in 5 times     How often have you found you stopped and started again several times when you urinated? Not at All     How often have you found it difficult to postpone urination? Not at All     How often have you had a weak urinary stream? Not at All     How often have you had to strain to start urination? Not at All     How many times did you typically get up at night to urinate? 2 Times     Total IPSS Score 3     Quality of Life due to urinary symptoms   If you were to spend the rest of your life with your urinary condition just the way it is now how would you feel about that? Delighted        Score:  1-7 Mild 8-19  Moderate 20-35 Severe  PMH: Past Medical History  Diagnosis Date  . Erectile dysfunction   . Blood in semen   . Over weight   . Hypertension   . Benign enlargement of prostate   . Androgen deficiency   . BPH (benign prostatic hyperplasia)   . Impotence     Surgical History: Past Surgical History  Procedure Laterality Date  . Shoulder surgery  2009    right  shoulder x 2  (20016)  . Varicocele excision  8  . Tonsillectomy    . Knee arthroscopy Right 2016  . Tricep surgery Left 2015    Home Medications:    Medication List       This list is accurate as of: 10/17/15  9:05 AM.  Always use your most recent med list.               aspirin 81 MG tablet  Take 81 mg by mouth daily.     CIALIS 20 MG tablet  Generic drug:  tadalafil  Take 1 tablet (20 mg total) by mouth daily as needed for erectile dysfunction. Take 2 hours prior to having intercourse on an empty stomach     diphenhydramine-acetaminophen 25-500 MG Tabs tablet  Commonly known as:  TYLENOL PM  Take by mouth.     Fish Oil 1000 MG Caps  Take by mouth. Reported on 10/17/2015     GREEN TEA PO  Take by mouth.     hydrochlorothiazide 25 MG tablet  Commonly known as:  HYDRODIURIL  TK 1 T PO QD     HYDROMET 5-1.5 MG/5ML syrup  Generic drug:  HYDROcodone-homatropine  TK 5 M L PO TID     JOINT HEALTH 750-375-30 MG Tabs  Generic drug:  Glucosamine-MSM-Hyaluronic Acd  Take by mouth. Reported on 10/17/2015     naproxen 500 MG tablet  Commonly known as:  NAPROSYN  Reported on 10/17/2015     RA NAPROXEN SODIUM 220 MG tablet  Generic drug:  naproxen sodium  Take by mouth. Reported on 10/17/2015     Testosterone 30 MG/ACT Soln  Commonly known as:  AXIRON  Apply  1 pump under each arm once daily     TRANSDERM-SCOP (1.5 MG) 1 MG/3DAYS  Generic drug:  scopolamine  APPLY 1 PATCH TO SKIN Q 3 DAYS        Allergies:  Allergies  Allergen Reactions  . Codeine Other (See Comments)    Reaction:   Hallucinations     Family History: Family History  Problem Relation Age of Onset  . Heart disease Mother 15  . Hypertension Mother   . Heart disease Father 60    MI   . Hypertension Brother   . Prostate cancer Neg Hx   . Kidney disease Neg Hx   . Kidney Stones Father     mother    Social History:  reports that he has never smoked. He does not have any smokeless tobacco history on file. He reports that he drinks about 1.2 oz of alcohol per week. He reports that he does not use illicit drugs.  ROS: UROLOGY Frequent Urination?: No Hard to postpone urination?: No Burning/pain with urination?: No Get up at night to urinate?: Yes Leakage of urine?: No Urine stream starts and stops?: No Trouble starting stream?: No Do you have to strain to urinate?: No Blood in urine?: No Urinary tract infection?: No Sexually transmitted disease?: No Injury to kidneys or bladder?: No Painful intercourse?: No Weak stream?: No Erection problems?: Yes Penile pain?: No  Gastrointestinal Nausea?: No Vomiting?: No Indigestion/heartburn?: No Diarrhea?: No Constipation?: No  Constitutional Fever: No Night sweats?: No Weight loss?: No Fatigue?: No  Skin Skin rash/lesions?: No Itching?: No  Eyes Blurred vision?: No Double vision?: No  Ears/Nose/Throat Sore throat?: No Sinus problems?: No  Hematologic/Lymphatic Swollen glands?: No Easy bruising?: No  Cardiovascular Leg swelling?: No Chest pain?: No  Respiratory Cough?: No Shortness of breath?: No  Endocrine Excessive thirst?: No  Musculoskeletal Back pain?: No Joint pain?: No  Neurological Headaches?: No Dizziness?: No  Psychologic Depression?: No Anxiety?: No  Physical Exam: BP 160/88 mmHg  Pulse 73  Ht 5\' 9"  (1.753 m)  Wt 223 lb 3.2 oz (101.243 kg)  BMI 32.95 kg/m2  GU: Patient with circumcised phallus.   Urethral meatus is patent.  No penile discharge. No penile lesions or rashes. Scrotum without  lesions, cysts, rashes and/or edema.  Testicles are located scrotally bilaterally. No masses are appreciated in the testicles. Left and right epididymis are normal. Rectal: Patient with  normal sphincter tone. Perineum without scarring or rashes. No rectal masses are appreciated. Prostate is approximately 50 grams, no nodules are appreciated. Seminal vesicles are normal.   Laboratory Data: Results for orders placed or performed in visit on 10/13/15  TSH  Result Value Ref Range   TSH 1.380 0.450 - 4.500 uIU/mL  Lipid panel  Result Value Ref Range   Cholesterol, Total 148 100 - 199 mg/dL   Triglycerides 160 (H) 0 - 149 mg/dL   HDL 24 (L) >39 mg/dL   VLDL Cholesterol Cal 32 5 - 40 mg/dL   LDL Calculated 92 0 - 99 mg/dL   Chol/HDL Ratio 6.2 (H) 0.0 - 5.0 ratio units  Hematocrit  Result Value Ref Range   Hematocrit 48.0 37.5 - 51.0 %  Testosterone  Result Value Ref Range   Testosterone 153 (L) 348 - 1197 ng/dL   Comment, Testosterone Comment   Hepatic function panel  Result Value Ref Range   Total Protein 7.0 6.0 - 8.5 g/dL   Albumin 4.3 3.6 - 4.8 g/dL   Bilirubin Total 0.6 0.0 - 1.2 mg/dL   Bilirubin, Direct 0.15 0.00 - 0.40 mg/dL   Alkaline Phosphatase 52  39 - 117 IU/L   AST 32 0 - 40 IU/L   ALT 45 (H) 0 - 44 IU/L  PSA  Result Value Ref Range   Prostate Specific Ag, Serum 1.4 0.0 - 4.0 ng/mL   Lab Results  Component Value Date   HGB 17.8 03/10/2014   HCT 48.0 10/13/2015   Lab Results  Component Value Date   CREATININE 1.17 04/21/2015   Lab Results  Component Value Date   TESTOSTERONE 153* 10/13/2015  PSA history:    1.1 ng/mL on 09/03/2013    1.2 ng/mL on 03/04/2014    1.2 ng/mL on 10/04/2014    1.6 ng/mL  on  04/13/2015    1.4 ng/mL on 10/13/2015   Assessment & Plan:    1. Hypogonadism:   Patient's testosterone is sub therapeutic at  153 ng/dL on 10/13/2015.   I will and increase his Axiron from ones swipe to one armpit, 4 days a week to one swipe to one  armpit daily.  He will return in one month for a serum testosterone draw.     2. Erectile dysfunction, unspecified erectile dysfunction type:   Patient's SH IM score is 20.  He is still responding  to Cialis 20 mg on-demand dosing, but he is noticing a decrease in its efficacy.  He has eliminated the medications from his regimen that have a side effect of erectile dysfunction and he did not see improvement.   His triglycerides were found to be elevated at this visit (160 mg/dL).   He try to improve his diet and increase his exercising to see if this improves his erections.  He will RTC in one month for a recheck of his lipids.    3. BPH (benign prostatic hyperplasia) with LUTS:   IPSS score is 3/0.  We will continue to monitor.  He will have a I PSS score, PSA and exam in 6 months.      Return in about 1 month (around 11/17/2015) for serum testosterone draw.  Zara Council, [facility] [address] [employer], [place] [address] [phone]

## 2015-11-08 ENCOUNTER — Other Ambulatory Visit: Payer: Self-pay | Admitting: Urology

## 2015-11-16 ENCOUNTER — Other Ambulatory Visit: Payer: Self-pay

## 2015-11-16 DIAGNOSIS — E291 Testicular hypofunction: Secondary | ICD-10-CM

## 2015-11-17 ENCOUNTER — Other Ambulatory Visit: Payer: BLUE CROSS/BLUE SHIELD

## 2015-11-17 DIAGNOSIS — E291 Testicular hypofunction: Secondary | ICD-10-CM

## 2015-11-18 LAB — LIPID PANEL
CHOLESTEROL TOTAL: 176 mg/dL (ref 100–199)
Chol/HDL Ratio: 6.1 ratio units — ABNORMAL HIGH (ref 0.0–5.0)
HDL: 29 mg/dL — AB (ref >39)
LDL CALC: 115 mg/dL — AB (ref 0–99)
TRIGLYCERIDES: 162 mg/dL — AB (ref 0–149)
VLDL CHOLESTEROL CAL: 32 mg/dL (ref 5–40)

## 2015-11-18 LAB — TESTOSTERONE: Testosterone: 924 ng/dL (ref 348–1197)

## 2015-11-21 ENCOUNTER — Encounter: Payer: Self-pay | Admitting: Urology

## 2015-11-22 ENCOUNTER — Telehealth: Payer: Self-pay

## 2015-11-22 NOTE — Telephone Encounter (Signed)
-----   Message from Nori Riis, PA-C sent at 11/18/2015  8:25 AM EST ----- Patient's testosterone level is great, but his lipids are not good.  I suggest he see his PCP for further workup and management of his cholesterol before continuing his testosterone therapy.

## 2015-11-22 NOTE — Telephone Encounter (Signed)
I checked with Emily Filbert about my lipids. My lipids were lower than in January on both your tests and the test from [place]. Here's his response: "I am very unsure as to why they think your lipids are not good, as you say they are better and certainly do not warrant any medical treatment other than a low-fat diet. Will follow for now". Since he and I have talked about a low fat diet to try to continue lowering them, are you ok with continuing on with the testosterone therapy? My BP is better, my overall health seems better, etc. I do note though that the scale is is measuring against is slightly higher than your scale. His says between 35 and 199 is acceptable, while I do know that most medical information says over 150 is higher than acceptable and that up to the 200 range is slightly high. He and I are actively monitoring this and are happy that they have dropped since January, and will be working to lower them without medicine.     Sent via [employer]

## 2015-11-22 NOTE — Telephone Encounter (Signed)
LMOM

## 2015-11-22 NOTE — Telephone Encounter (Signed)
There have been studies that have suggested that testosterone therapy can increase your risk for heart attack.  If Dr. Sabra Heck feels that Raymond Flowers' lipids do not put him at risk for heart disease then we can continue the testosterone therapy, but we need it documented.

## 2015-11-23 NOTE — Telephone Encounter (Signed)
Spoke with pt in reference lipids and testosterone. Pt stated that he has previously seen his PCP for this. Made aware he will a written letter from PCP before we can continue with testosterone therapy. Pt voiced understanding.

## 2015-11-30 NOTE — Telephone Encounter (Signed)
Letter from Emily Filbert, MD addressing this concern has been received and scanned into the patient's chart.

## 2015-12-04 ENCOUNTER — Telehealth: Payer: Self-pay | Admitting: Urology

## 2015-12-04 NOTE — Telephone Encounter (Signed)
Patient's PCP is comfortable with his lipid levels.  He may continue his testosterone therapy and follow up as scheduled.

## 2015-12-13 ENCOUNTER — Encounter: Payer: Self-pay | Admitting: Urology

## 2016-01-14 ENCOUNTER — Other Ambulatory Visit: Payer: Self-pay | Admitting: Urology

## 2016-03-04 ENCOUNTER — Other Ambulatory Visit: Payer: Self-pay | Admitting: Urology

## 2016-03-21 ENCOUNTER — Encounter: Payer: Self-pay | Admitting: Urology

## 2016-03-23 ENCOUNTER — Other Ambulatory Visit: Payer: Self-pay

## 2016-03-23 ENCOUNTER — Other Ambulatory Visit: Payer: Self-pay | Admitting: Internal Medicine

## 2016-03-23 DIAGNOSIS — N400 Enlarged prostate without lower urinary tract symptoms: Secondary | ICD-10-CM

## 2016-03-23 DIAGNOSIS — E291 Testicular hypofunction: Secondary | ICD-10-CM

## 2016-03-23 DIAGNOSIS — Z8679 Personal history of other diseases of the circulatory system: Secondary | ICD-10-CM

## 2016-03-26 ENCOUNTER — Other Ambulatory Visit: Payer: BLUE CROSS/BLUE SHIELD

## 2016-03-26 DIAGNOSIS — E291 Testicular hypofunction: Secondary | ICD-10-CM

## 2016-03-26 DIAGNOSIS — N400 Enlarged prostate without lower urinary tract symptoms: Secondary | ICD-10-CM

## 2016-03-27 LAB — TESTOSTERONE: Testosterone: 692 ng/dL (ref 348–1197)

## 2016-03-27 LAB — PSA: Prostate Specific Ag, Serum: 1.4 ng/mL (ref 0.0–4.0)

## 2016-03-27 LAB — HEMATOCRIT: Hematocrit: 48 % (ref 37.5–51.0)

## 2016-03-30 ENCOUNTER — Ambulatory Visit
Admission: RE | Admit: 2016-03-30 | Discharge: 2016-03-30 | Disposition: A | Discharge status: Home / Self Care | Payer: BLUE CROSS/BLUE SHIELD | Source: Ambulatory Visit | Attending: Internal Medicine | Admitting: Internal Medicine

## 2016-03-30 DIAGNOSIS — Z8679 Personal history of other diseases of the circulatory system: Secondary | ICD-10-CM

## 2016-03-30 DIAGNOSIS — I6521 Occlusion and stenosis of right carotid artery: Secondary | ICD-10-CM | POA: Diagnosis not present

## 2016-04-11 ENCOUNTER — Ambulatory Visit: Payer: BLUE CROSS/BLUE SHIELD | Admitting: Urology

## 2016-04-22 NOTE — Progress Notes (Signed)
4:37 PM   Raymond Flowers October 13, 1955 [id number]  Referring provider: Rusty Aus, MD [facility] [place], [place] [address]  Chief Complaint  Patient presents with  . Benign Prostatic Hypertrophy    6 month follow up   . Hypogonadism    HPI: [name] is a 60 year old Caucasian male with erectile dysfunction, hypogonadism and BPH with LUTS who presents today for 6 month follow-up.  Hypogonadism Patient is experiencing his erections being less strong.  This is indicated by his responses to the ADAM questionnaire.  He is having spontaneous erections at night.  He does not have sleep apnea.  His current testosterone level is 692 ng/dL on 03/26/2016.  He is currently managing his hypogonadism with Axiron, one swipe to each axilla daily.        Androgen Deficiency in the Aging Male    Raymond Flowers 04/23/16 1600         Androgen Deficiency in the Aging Male   Do you have a decrease in libido (sex drive) No     Do you have lack of energy No     Do you have a decrease in strength and/or endurance No     Have you lost height No     Have you noticed a decreased "enjoyment of life" No     Are you sad and/or grumpy No     Are your erections less strong Yes     Have you noticed a recent deterioration in your ability to play sports No     Are you falling asleep after dinner No     Has there been a recent deterioration in your work performance No         Erectile dysfunction Patient's SHIM score is 18 , which is mild erectile dysfunction.  His previous SHIM score was 20.  He is currently managing his erectile dysfunction with Cialis 20 mg on-demand dosing.  He denies any penile curvature or pain with erections, but has been experiencing a loss of erections during intercourse.  He is anxious that this may be a sign of increasing ED.  He and his wife have a good sex life, having intercourse several times during the week.  He does not want  to lose that lifestyle.  He has been experiencing this more often.  He noticed mostly when he is tired or after a couple glasses of wine.        SHIM    Row Flowers 04/23/16 1611         SHIM: Over the last 6 months:   How do you rate your confidence that you could get and keep an erection? Low     When you had erections with sexual stimulation, how often were your erections hard enough for penetration (entering your partner)? Most Times (much more than half the time)     During sexual intercourse, how often were you able to maintain your erection after you had penetrated (entered) your partner? Slightly Difficult     During sexual intercourse, how difficult was it to maintain your erection to completion of intercourse? Slightly Difficult     When you attempted sexual intercourse, how often was it satisfactory for you? Slightly Difficult       SHIM Total Score   SHIM 18        Score: 1-7 Severe ED 8-11 Moderate ED 12-16 Mild-Moderate ED 17-21 Mild ED 22-25 No ED  BPH  with LUTS His IPSS score today is 4, which is mild lower urinary tract symptomatology. He is pleased with delighted of life due to his urinary symptoms.  His previous IPSS score was 3/0.  He is not experiencing dysuria, gross hematuria or suprapubic pain. He also denies any recent fevers, chills, nausea, vomiting or urinary tract infections.      IPSS    Row Flowers 04/23/16 1600         International Prostate Symptom Score   How often have you had the sensation of not emptying your bladder? Not at All     How often have you had to urinate less than every two hours? Less than half the time     How often have you found you stopped and started again several times when you urinated? Not at All     How often have you found it difficult to postpone urination? Not at All     How often have you had a weak urinary stream? Not at All     How often have you had to strain to start urination? Not at All     How many times did  you typically get up at night to urinate? 2 Times     Total IPSS Score 4       Quality of Life due to urinary symptoms   If you were to spend the rest of your life with your urinary condition just the way it is now how would you feel about that? Pleased        Score:  1-7 Mild 8-19 Moderate 20-35 Severe  PMH: Past Medical History:  Diagnosis Date  . Androgen deficiency   . Benign enlargement of prostate   . Blood in semen   . BPH (benign prostatic hyperplasia)   . Erectile dysfunction   . Hypertension   . Impotence   . Over weight     Surgical History: Past Surgical History:  Procedure Laterality Date  . KNEE ARTHROSCOPY Right 2016  . SHOULDER SURGERY  2009   right  shoulder x 2  (20016)  . TONSILLECTOMY    . tricep surgery Left 2015  . VARICOCELE EXCISION  8    Home Medications:    Medication List       Accurate as of 04/23/16  4:37 PM. Always use your most recent med list.          aspirin 81 MG tablet Take 81 mg by mouth daily.   CIALIS 20 MG tablet Generic drug:  tadalafil Take 1 tablet (20 mg total) by mouth daily as needed for erectile dysfunction. Take 2 hours prior to having intercourse on an empty stomach   [facility] GLUCOSAMINE-CHONDROITIN 500-400 MG tablet Generic drug:  glucosamine-chondroitin Take by mouth.   cyclobenzaprine 10 MG tablet Commonly known as:  FLEXERIL Take by mouth.   diphenhydramine-acetaminophen 25-500 MG Tabs tablet Commonly known as:  TYLENOL PM Take by mouth.   Fish Oil 1000 MG Caps Take by mouth. Reported on 10/17/2015   GREEN TEA PO Take by mouth.   hydrochlorothiazide 25 MG tablet Commonly known as:  HYDRODIURIL TK 1 T PO QD   HYDROMET 5-1.5 MG/5ML syrup Generic drug:  HYDROcodone-homatropine TK 5 M L PO TID   JOINT HEALTH 750-375-30 MG Tabs Generic drug:  Glucosamine-MSM-Hyaluronic Acd Take by mouth. Reported on 10/17/2015   naproxen 500 MG tablet Commonly known as:  NAPROSYN Reported on 10/17/2015   RA  NAPROXEN SODIUM 220 MG tablet  Generic drug:  naproxen sodium Take by mouth. Reported on 10/17/2015   Testosterone 30 MG/ACT Soln Commonly known as:  AXIRON APPLY 1 PUMP UNDER EACH ARM ONCE DAILY   TRANSDERM-SCOP (1.5 MG) 1 MG/3DAYS Generic drug:  scopolamine APPLY 1 PATCH TO SKIN Q 3 DAYS   TUMERSAID Tabs Take by mouth.       Allergies:  Allergies  Allergen Reactions  . Codeine Other (See Comments)    Reaction:  Hallucinations     Family History: Family History  Problem Relation Age of Onset  . Heart disease Mother 7  . Hypertension Mother   . Heart disease Father 82    MI   . Kidney Stones Father     mother  . Hypertension Brother   . Prostate cancer Neg Hx   . Kidney disease Neg Hx     Social History:  reports that he has never smoked. He has never used smokeless tobacco. He reports that he drinks about 1.2 oz of alcohol per week . He reports that he does not use drugs.  ROS: UROLOGY Frequent Urination?: No Hard to postpone urination?: No Burning/pain with urination?: No Get up at night to urinate?: No Leakage of urine?: No Urine stream starts and stops?: No Trouble starting stream?: No Do you have to strain to urinate?: No Blood in urine?: No Urinary tract infection?: No Sexually transmitted disease?: No Injury to kidneys or bladder?: No Painful intercourse?: No Weak stream?: No Erection problems?: Yes Penile pain?: No  Gastrointestinal Nausea?: No Vomiting?: No Indigestion/heartburn?: No Diarrhea?: No Constipation?: No  Constitutional Fever: No Night sweats?: No Weight loss?: No Fatigue?: No  Skin Skin rash/lesions?: No Itching?: No  Eyes Blurred vision?: No Double vision?: No  Ears/Nose/Throat Sore throat?: No Sinus problems?: No  Hematologic/Lymphatic Swollen glands?: No Easy bruising?: No  Cardiovascular Leg swelling?: No Chest pain?: No  Respiratory Cough?: No Shortness of breath?: No  Endocrine Excessive  thirst?: No  Musculoskeletal Back pain?: No Joint pain?: No  Neurological Headaches?: No Dizziness?: No  Psychologic Depression?: No Anxiety?: No  Physical Exam: BP (!) 159/73   Pulse 72   Ht 5\' 10"  (1.778 m)   Wt 216 lb (98 kg)   BMI 30.99 kg/m   Constitutional: Well nourished. Alert and oriented, No acute distress. HEENT: [place] AT, moist mucus membranes. Trachea midline, no masses. Cardiovascular: No clubbing, cyanosis, or edema. Respiratory: Normal respiratory effort, no increased work of breathing. GI: Abdomen is soft, non tender, non distended, no abdominal masses. Liver and spleen not palpable.  No hernias appreciated.  Stool sample for occult testing is not indicated.   GU: No CVA tenderness.  No bladder fullness or masses.  Patient with circumcised phallus.  Urethral meatus is patent.  No penile discharge. No penile lesions or rashes. Scrotum without lesions, cysts, rashes and/or edema.  Testicles are located scrotally bilaterally. No masses are appreciated in the testicles. Left and right epididymis are normal. Rectal: Patient with  normal sphincter tone. Anus and perineum without scarring or rashes. No rectal masses are appreciated. Prostate is approximately 45 grams, no nodules are appreciated. Seminal vesicles are normal. Skin: No rashes, bruises or suspicious lesions. Lymph: No cervical or inguinal adenopathy. Neurologic: Grossly intact, no focal deficits, moving all 4 extremities. Psychiatric: Normal mood and affect.   Laboratory Data: Results for orders placed or performed in visit on 03/26/16  PSA  Result Value Ref Range   Prostate Specific Ag, Serum 1.4 0.0 - 4.0 ng/mL  Hematocrit  Result Value Ref Range   Hematocrit 48.0 37.5 - 51.0 %  Testosterone  Result Value Ref Range   Testosterone 692 348 - 1,197 ng/dL   Comment, Testosterone Comment    Lab Results  Component Value Date   HGB 17.8 03/10/2014   HCT 48.0 03/26/2016   Lab Results  Component  Value Date   CREATININE 1.17 04/21/2015   Lab Results  Component Value Date   TESTOSTERONE 692 03/26/2016  PSA history:    1.1 ng/mL on 09/03/2013    1.2 ng/mL on 03/04/2014    1.2 ng/mL on 10/04/2014    1.6 ng/mL  on  04/13/2015    1.4 ng/mL on 10/13/2015   Assessment & Plan:    1. Hypogonadism:     -most recent testosterone level is 692 ng/dL on 03/26/2016  -continue Axiron, one swipe to axilla daily  -RTC in 3 months for HCT and testosterone  -RTC in 6 months for HCT, testosterone, PSA, LFT's, ADAM and exam  2. BPH with LUTS  - IPSS score is 4/1, it is stable  - Continue conservative management, avoiding bladder irritants and timed voiding's  - RTC in 6 months for IPSS, PSA and exam, as testosterone therapy can cause prostate enlargement and worsen LUTS  3. Erectile dysfunction:     -SHIM score is 18  -Start Cialis 5 mg daily for two weeks, hold Cialis 20 mg on demand dosing, patient to contact me regarding effect  -RTC in 6 months for SHIM score and exam, as testosterone therapy can affect erections    Return in about 3 months (around 07/24/2016) for HCT, PSA and testosterone labs.  Zara Council, [facility] [address] [place], [place] [address] [phone]

## 2016-04-23 ENCOUNTER — Ambulatory Visit (INDEPENDENT_AMBULATORY_CARE_PROVIDER_SITE_OTHER): Payer: BLUE CROSS/BLUE SHIELD | Admitting: Urology

## 2016-04-23 ENCOUNTER — Encounter: Payer: Self-pay | Admitting: Urology

## 2016-04-23 VITALS — BP 159/73 | HR 72 | Ht 70.0 in | Wt 216.0 lb

## 2016-04-23 DIAGNOSIS — E291 Testicular hypofunction: Secondary | ICD-10-CM | POA: Diagnosis not present

## 2016-04-23 DIAGNOSIS — N138 Other obstructive and reflux uropathy: Secondary | ICD-10-CM

## 2016-04-23 DIAGNOSIS — N401 Enlarged prostate with lower urinary tract symptoms: Secondary | ICD-10-CM

## 2016-04-23 DIAGNOSIS — N529 Male erectile dysfunction, unspecified: Secondary | ICD-10-CM

## 2016-04-23 MED ORDER — TESTOSTERONE 30 MG/ACT TD SOLN: TRANSDERMAL | 5 refills | Status: DC

## 2016-05-09 ENCOUNTER — Other Ambulatory Visit: Payer: Self-pay | Admitting: Urology

## 2016-05-09 ENCOUNTER — Encounter: Payer: Self-pay | Admitting: Urology

## 2016-05-09 MED ORDER — TADALAFIL 5 MG PO TABS: ORAL_TABLET | ORAL | 12 refills | Status: DC

## 2016-05-15 ENCOUNTER — Encounter: Payer: Self-pay | Admitting: Urology

## 2016-05-21 ENCOUNTER — Other Ambulatory Visit: Payer: Self-pay

## 2016-05-21 DIAGNOSIS — N529 Male erectile dysfunction, unspecified: Secondary | ICD-10-CM

## 2016-05-21 MED ORDER — SILDENAFIL CITRATE 20 MG PO TABS: 20.0000 mg | ORAL_TABLET | ORAL | Status: DC | PRN

## 2016-07-23 ENCOUNTER — Other Ambulatory Visit: Payer: Self-pay

## 2016-07-23 DIAGNOSIS — E291 Testicular hypofunction: Secondary | ICD-10-CM

## 2016-07-24 ENCOUNTER — Other Ambulatory Visit: Payer: BLUE CROSS/BLUE SHIELD

## 2016-07-24 DIAGNOSIS — E291 Testicular hypofunction: Secondary | ICD-10-CM

## 2016-07-25 LAB — PSA: PROSTATE SPECIFIC AG, SERUM: 1.2 ng/mL (ref 0.0–4.0)

## 2016-07-25 LAB — TESTOSTERONE: TESTOSTERONE: 663 ng/dL (ref 264–916)

## 2016-07-25 LAB — HEMATOCRIT: HEMATOCRIT: 46.6 % (ref 37.5–51.0)

## 2016-08-05 IMAGING — MR MR KNEE*R* W/O CM
4 of 5 series · 20 of 40 positions shown · non-contrast
Comparison: None.

CLINICAL DATA: Twisting injury status post fall

EXAM:
MRI OF THE RIGHT KNEE WITHOUT CONTRAST
TECHNIQUE: Multiplanar, multisequence MR imaging of the knee was performed. No
intravenous contrast was administered.

[Series 4: pd_tse_fs_cor · coronal · 3.2mm · 0.50mm/px · 3 of 25 slices shown]
[im 4/25]
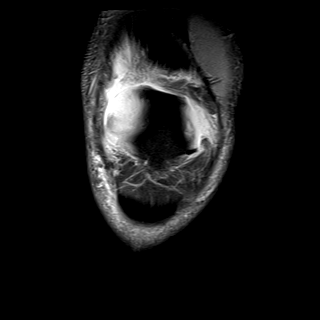
[im 14/25]
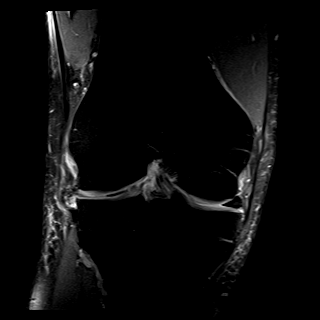
[im 21/25]
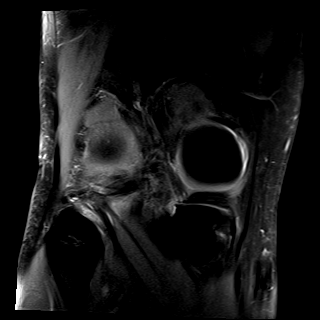

[Series 5: T2 fat-sat · coronal · 3.2mm · 0.62mm/px · 6 of 25 slices shown]
[im 1/25]
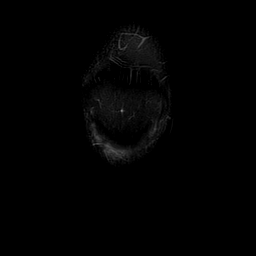
[im 4/25]
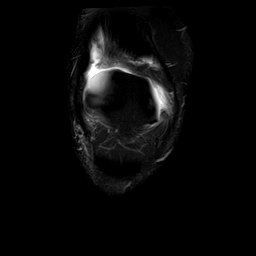
[im 7/25]
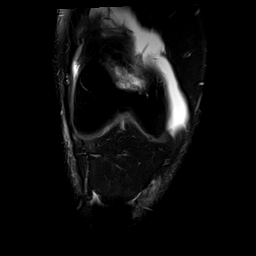
[im 11/25]
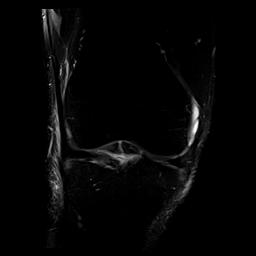
[im 14/25]
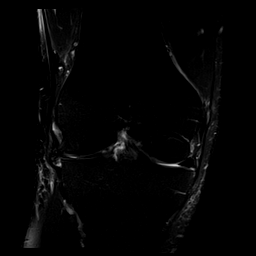
[im 21/25]
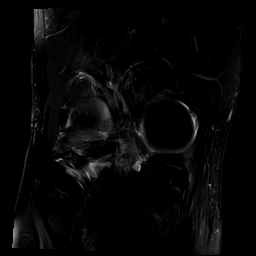

[Series 6: T1 · coronal · 3.2mm · 0.25mm/px · 3 of 25 slices shown]
[im 4/25]
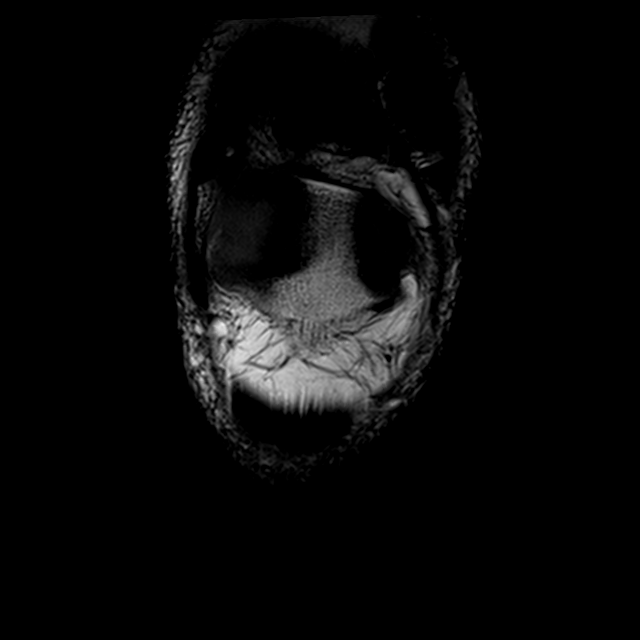
[im 14/25]
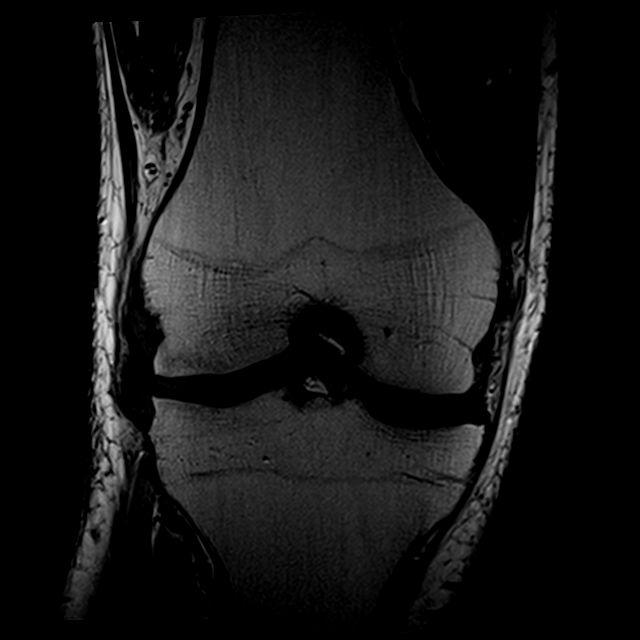
[im 21/25]
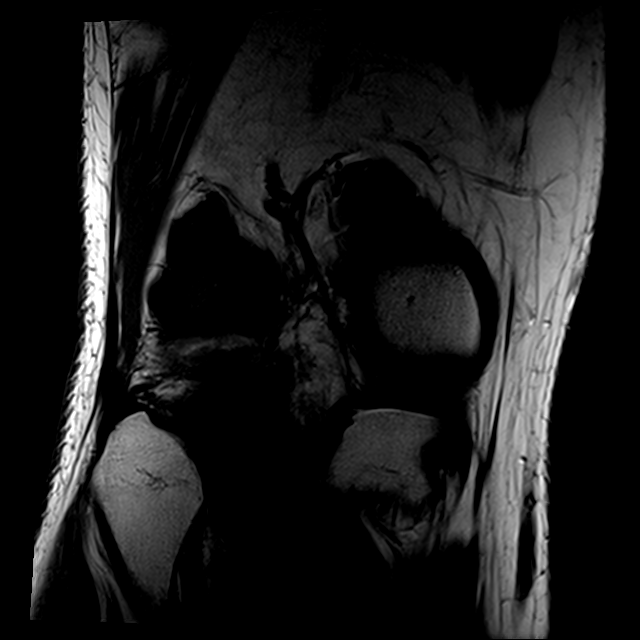

[Series 9: PD fat-sat · sagittal · 3.5mm · 0.25mm/px · 8 of 25 slices shown]
[im 1/25]
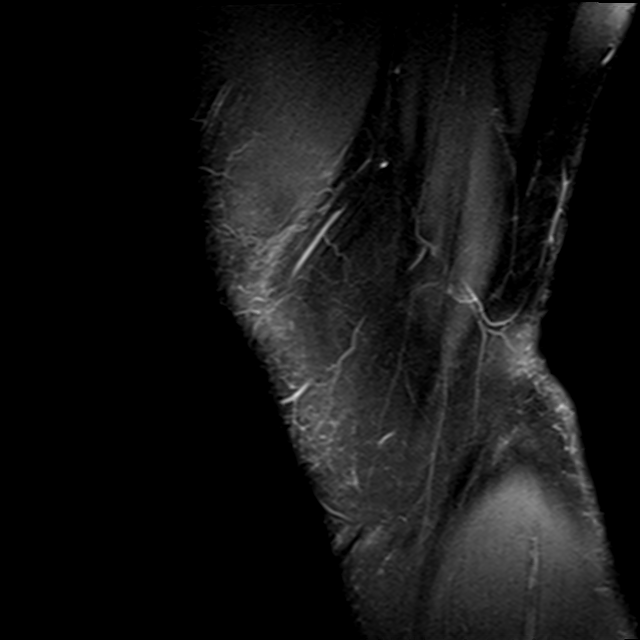
[im 4/25]
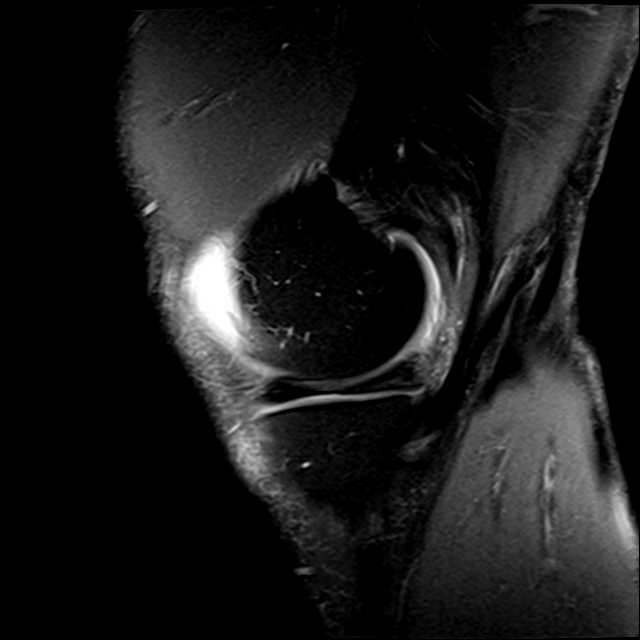
[im 7/25]
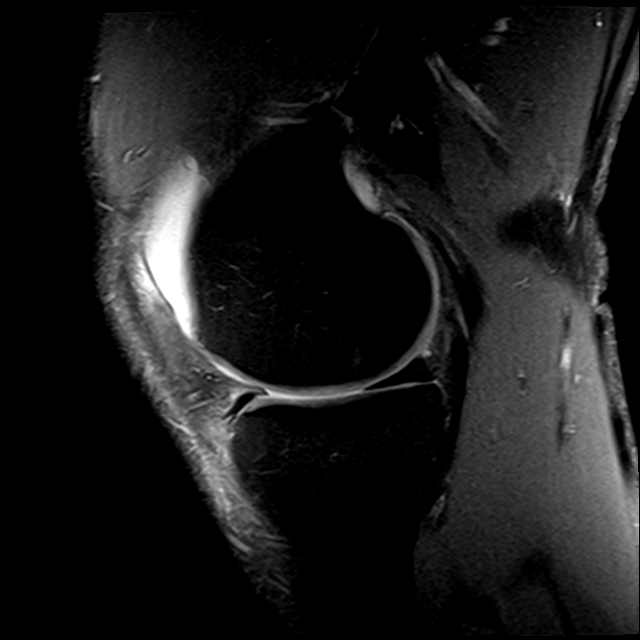
[im 11/25]
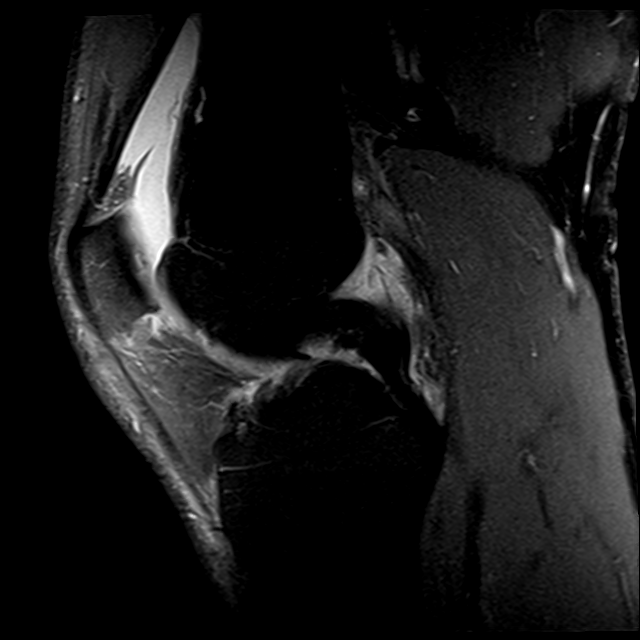
[im 14/25]
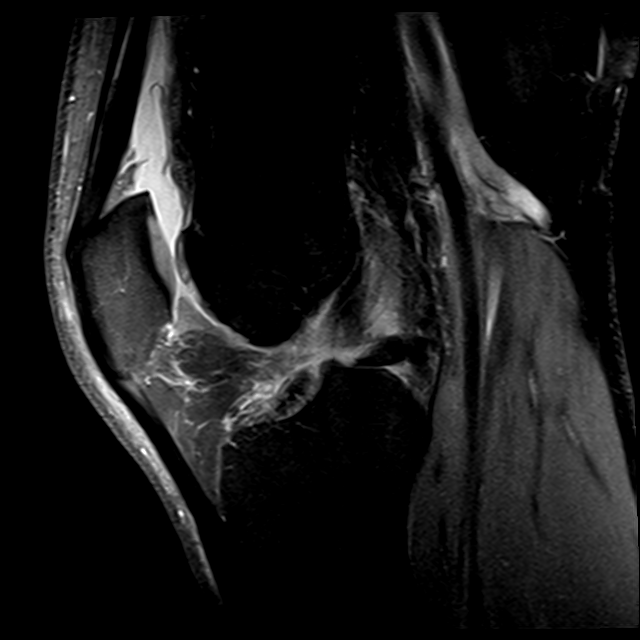
[im 18/25]
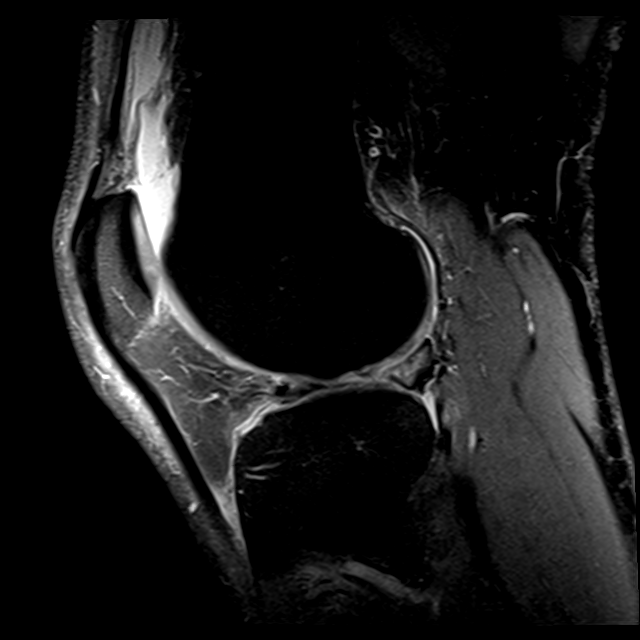
[im 21/25]
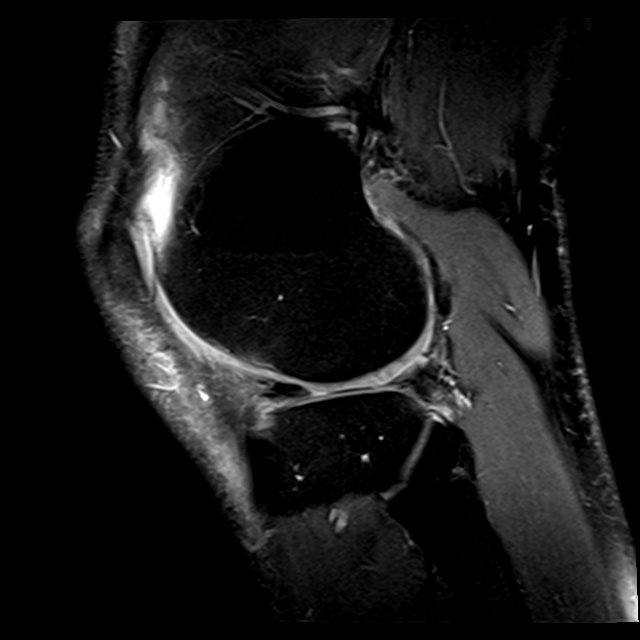
[im 25/25]
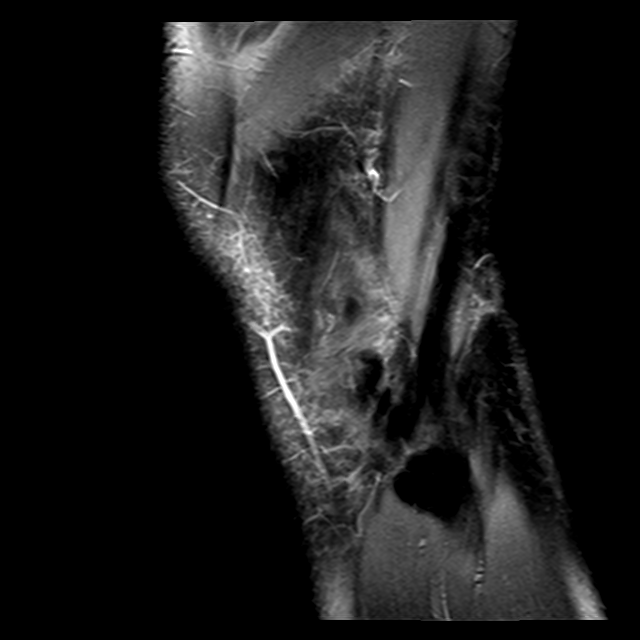

[20 of 40 positions shown; findings below may reference images not displayed]

FINDINGS: MENISCI

Medial meniscus: Mild increased signal in the posterior horn of the
medial meniscus likely reflecting degeneration without a discrete
tear.

Lateral meniscus: Radial tear of the posterior horn of the lateral
meniscus. Oblique tear of the anterior horn of the lateral meniscus
extending to the superior articular surface.

LIGAMENTS

Cruciates:  Intact ACL and PCL.

Collaterals: Medial collateral ligament is intact. The iliotibial
band and biceps femoris tendons are intact. The fibular collateral
ligament has a wavy appearance concerning for a complete tear.

CARTILAGE

Patellofemoral: Small focal area of chondromalacia in the medial
patellar facet. High-grade partial-thickness cartilage loss of the
trochlear groove.

Medial: Partial thickness cartilage loss of the medial tibial
plateau medial femoral condyle.

Lateral: Cartilage irregularity with partial thickness cartilage
loss of the lateral femoral condyle and lateral tibial plateau.

Joint: Large joint effusion. Normal Hoffa's fat. No plical
thickening.

Popliteal Fossa:  Intact popliteus tendon.  No Baker cyst.

Extensor Mechanism:

Bones:  No marrow signal abnormality.
IMPRESSION: 1. Radial tear of the posterior horn of the lateral meniscus.
Oblique tear of the anterior horn of the lateral meniscus extending
to the superior articular surface.
2. Tricompartmental cartilage abnormalities as described above.
3. Large joint effusion.
4. The fibular collateral ligament has a wavy appearance concerning
for a complete tear.

## 2016-09-12 ENCOUNTER — Other Ambulatory Visit: Payer: Self-pay | Admitting: Urology

## 2016-09-12 DIAGNOSIS — N529 Male erectile dysfunction, unspecified: Secondary | ICD-10-CM

## 2016-10-01 ENCOUNTER — Other Ambulatory Visit: Payer: Self-pay | Admitting: Otolaryngology

## 2016-10-01 DIAGNOSIS — E041 Nontoxic single thyroid nodule: Secondary | ICD-10-CM

## 2016-10-11 ENCOUNTER — Ambulatory Visit
Admission: RE | Admit: 2016-10-11 | Discharge: 2016-10-11 | Disposition: A | Discharge status: Home / Self Care | Payer: BLUE CROSS/BLUE SHIELD | Source: Ambulatory Visit | Attending: Otolaryngology | Admitting: Otolaryngology

## 2016-10-11 ENCOUNTER — Ambulatory Visit: Admission: RE | Admit: 2016-10-11 | Payer: BLUE CROSS/BLUE SHIELD | Source: Ambulatory Visit

## 2016-10-11 DIAGNOSIS — E041 Nontoxic single thyroid nodule: Secondary | ICD-10-CM

## 2016-10-11 DIAGNOSIS — E049 Nontoxic goiter, unspecified: Secondary | ICD-10-CM | POA: Insufficient documentation

## 2016-10-17 ENCOUNTER — Other Ambulatory Visit: Payer: Self-pay | Admitting: Urology

## 2016-10-17 ENCOUNTER — Encounter: Payer: Self-pay | Admitting: Urology

## 2016-10-17 DIAGNOSIS — N529 Male erectile dysfunction, unspecified: Secondary | ICD-10-CM

## 2016-10-17 MED ORDER — SILDENAFIL CITRATE 20 MG PO TABS: ORAL_TABLET | ORAL | 12 refills | Status: DC

## 2016-10-23 NOTE — Progress Notes (Signed)
9:16 AM   [place] October 18, 1955 [id number]  Referring provider: Rusty Aus, MD [facility], [place] [address]  Chief Complaint  Patient presents with  . Hypogonadism    HPI: Raymond Flowers is a 61 year old Caucasian male with erectile dysfunction, hypogonadism and BPH with LUTS who presents today for 6 month follow-up.  Hypogonadism Patient is experiencing a decrease in libido, a lack of energy, a decrease in strength and erections being less strong.  This is indicated by his responses to the ADAM questionnaire.  He is still having spontaneous erections at night, but they are not as frequent.  He does not have sleep apnea.  His current testosterone level is 663 ng/dL on 07/24/2016.  He is currently managing his hypogonadism with Axiron, one swipe to each axilla daily.       Androgen Deficiency in the Aging Male    [place] Name 10/24/16 0800         Androgen Deficiency in the Aging Male   Do you have a decrease in libido (sex drive) Yes     Do you have lack of energy Yes     Do you have a decrease in strength and/or endurance Yes     Have you lost height No     Have you noticed a decreased "enjoyment of life" No     Are you sad and/or grumpy No     Are your erections less strong Yes     Have you noticed a recent deterioration in your ability to play sports No     Are you falling asleep after dinner No     Has there been a recent deterioration in your work performance No         Erectile dysfunction Patient's SHIM score is 18 , which is mild erectile dysfunction.  His previous SHIM score was 18.  He is currently managing his erectile dysfunction with Viagra 100 mg.  He denies any penile curvature or pain with erections, but has been experiencing a loss of erections during intercourse.  He is anxious that this may be a sign of increasing ED.  He and his wife have a good sex life, having intercourse several times during  the week.  He does not want to lose that lifestyle.  He has been experiencing this more often.  He noticed mostly when he is tired or after a couple glasses of wine.  He states that recently he is having to stop during intercourse to catch his breath.  He denies any chest pains at that time.  It mostly occurs when he takes his sildenafil.       SHIM    Row Name 10/24/16 0848         SHIM: Over the last 6 months:   How do you rate your confidence that you could get and keep an erection? Moderate     When you had erections with sexual stimulation, how often were your erections hard enough for penetration (entering your partner)? Almost Always or Always     During sexual intercourse, how often were you able to maintain your erection after you had penetrated (entered) your partner? Difficult     During sexual intercourse, how difficult was it to maintain your erection to completion of intercourse? Difficult     When you attempted sexual intercourse, how often was it satisfactory for you? Slightly Difficult       SHIM Total  Score   SHIM 18        Score: 1-7 Severe ED 8-11 Moderate ED 12-16 Mild-Moderate ED 17-21 Mild ED 22-25 No ED    BPH with LUTS His IPSS score today is 5, which is mild lower urinary tract symptomatology. He is pleased with delighted of life due to his urinary symptoms.  His previous IPSS score was 4/0.  His main complaint today is nocturia x 3.  He is not experiencing dysuria, gross hematuria or suprapubic pain. He also denies any recent fevers, chills, nausea, vomiting or urinary tract infections.     IPSS    Row Name 10/24/16 0800         International Prostate Symptom Score   How often have you had the sensation of not emptying your bladder? Not at All     How often have you had to urinate less than every two hours? About half the time     How often have you found you stopped and started again several times when you urinated? Not at All     How often have  you found it difficult to postpone urination? Not at All     How often have you had a weak urinary stream? Not at All     How often have you had to strain to start urination? Not at All     How many times did you typically get up at night to urinate? 2 Times     Total IPSS Score 5       Quality of Life due to urinary symptoms   If you were to spend the rest of your life with your urinary condition just the way it is now how would you feel about that? Pleased        Score:  1-7 Mild 8-19 Moderate 20-35 Severe  PMH: Past Medical History:  Diagnosis Date  . Androgen deficiency   . Benign enlargement of prostate   . Blood in semen   . BPH (benign prostatic hyperplasia)   . Erectile dysfunction   . Hypertension   . Impotence   . Over weight     Surgical History: Past Surgical History:  Procedure Laterality Date  . KNEE ARTHROSCOPY Right 2016  . SHOULDER SURGERY  2009   right  shoulder x 2  (20016)  . TONSILLECTOMY    . tricep surgery Left 2015  . VARICOCELE EXCISION  8    Home Medications:  Allergies as of 10/24/2016      Reactions   Codeine Other (See Comments)   Reaction:  Hallucinations      Medication List       Accurate as of 10/24/16  9:16 AM. Always use your most recent med list.          aspirin 81 MG tablet Take 81 mg by mouth daily.   [facility] GLUCOSAMINE-CHONDROITIN 500-400 MG tablet Generic drug:  glucosamine-chondroitin Take by mouth.   cyclobenzaprine 10 MG tablet Commonly known as:  FLEXERIL Take by mouth.   diphenhydramine-acetaminophen 25-500 MG Tabs tablet Commonly known as:  TYLENOL PM Take by mouth.   Fish Oil 1000 MG Caps Take by mouth. Reported on 10/17/2015   GREEN TEA PO Take by mouth.   hydrochlorothiazide 25 MG tablet Commonly known as:  HYDRODIURIL TK 1 T PO QD   HYDROMET 5-1.5 MG/5ML syrup Generic drug:  HYDROcodone-homatropine TK 5 M L PO TID   naproxen 500 MG tablet Commonly known as:  NAPROSYN Reported on  10/17/2015   sildenafil 20 MG tablet Commonly known as:  REVATIO TAKE 2 TO 5 TABLETS PRIOR TO INTERCOURSE   tadalafil 5 MG tablet Commonly known as:  CIALIS Take one tablet daily   Testosterone 30 MG/ACT Soln Commonly known as:  AXIRON APPLY 1 PUMP UNDER EACH ARM ONCE DAILY   TRANSDERM-SCOP (1.5 MG) 1 MG/3DAYS Generic drug:  scopolamine APPLY 1 PATCH TO SKIN Q 3 DAYS   TUMERSAID Tabs Take by mouth.       Allergies:  Allergies  Allergen Reactions  . Codeine Other (See Comments)    Reaction:  Hallucinations     Family History: Family History  Problem Relation Age of Onset  . Heart disease Mother 26  . Hypertension Mother   . Heart disease Father 30    MI   . Kidney Stones Father     mother  . Hypertension Brother   . Prostate cancer Neg Hx   . Kidney disease Neg Hx     Social History:  reports that he has never smoked. He has never used smokeless tobacco. He reports that he drinks about 1.2 oz of alcohol per week . He reports that he does not use drugs.  ROS: UROLOGY Frequent Urination?: No Hard to postpone urination?: No Burning/pain with urination?: No Get up at night to urinate?: No Leakage of urine?: No Urine stream starts and stops?: No Trouble starting stream?: No Do you have to strain to urinate?: No Blood in urine?: No Urinary tract infection?: No Sexually transmitted disease?: No Injury to kidneys or bladder?: No Painful intercourse?: No Weak stream?: No Erection problems?: Yes Penile pain?: No  Gastrointestinal Nausea?: No Vomiting?: No Indigestion/heartburn?: No Diarrhea?: No Constipation?: No  Constitutional Fever: No Night sweats?: No Weight loss?: No Fatigue?: No  Skin Skin rash/lesions?: No Itching?: No  Eyes Blurred vision?: No Double vision?: No  Ears/Nose/Throat Sore throat?: No Sinus problems?: No  Hematologic/Lymphatic Swollen glands?: No Easy bruising?: No  Cardiovascular Leg swelling?: No Chest pain?:  No  Respiratory Cough?: No Shortness of breath?: No  Endocrine Excessive thirst?: No  Musculoskeletal Back pain?: No Joint pain?: No  Neurological Headaches?: No Dizziness?: No  Psychologic Depression?: No Anxiety?: No  Physical Exam: BP 138/80 (BP Location: Left Arm, Patient Position: Sitting, Cuff Size: Normal)   Pulse (!) 58   Ht 5\' 10"  (1.778 m)   Wt 215 lb 4.8 oz (97.7 kg)   BMI 30.89 kg/m   Constitutional: Well nourished. Alert and oriented, No acute distress. HEENT: [place] AT, moist mucus membranes. Trachea midline, no masses. Cardiovascular: No clubbing, cyanosis, or edema. Respiratory: Normal respiratory effort, no increased work of breathing. GI: Abdomen is soft, non tender, non distended, no abdominal masses. Liver and spleen not palpable.  No hernias appreciated.  Stool sample for occult testing is not indicated.   GU: No CVA tenderness.  No bladder fullness or masses.  Patient with circumcised phallus.  Urethral meatus is patent.  No penile discharge. No penile lesions or rashes. Scrotum without lesions, cysts, rashes and/or edema.  Testicles are located scrotally bilaterally. No masses are appreciated in the testicles. Left and right epididymis are normal. Rectal: Patient with  normal sphincter tone. Anus and perineum without scarring or rashes. No rectal masses are appreciated. Prostate is approximately 45 grams, no nodules are appreciated. Seminal vesicles are normal. Skin: No rashes, bruises or suspicious lesions. Lymph: No cervical or inguinal adenopathy. Neurologic: Grossly intact, no focal deficits, moving all 4 extremities. Psychiatric: Normal mood  and affect.   Laboratory Data: Results for orders placed or performed in visit on 07/24/16  Testosterone  Result Value Ref Range   Testosterone 663 264 - 916 ng/dL  PSA  Result Value Ref Range   Prostate Specific Ag, Serum 1.2 0.0 - 4.0 ng/mL  Hematocrit  Result Value Ref Range   Hematocrit 46.6 37.5 -  51.0 %   Lab Results  Component Value Date   HGB 17.8 03/10/2014   HCT 46.6 07/24/2016   Lab Results  Component Value Date   CREATININE 1.17 04/21/2015   Lab Results  Component Value Date   TESTOSTERONE 663 07/24/2016  PSA history:     1.1 ng/mL on 09/03/2013     1.2 ng/mL on 03/04/2014     1.2 ng/mL on 10/04/2014     1.6 ng/mL  on  04/13/2015     1.4 ng/mL on 10/13/2015  1.2 ng/mL on 07/24/2016   Assessment & Plan:    1. Hypogonadism:     -most recent testosterone level is 663 ng/dL on 07/24/2016  -continue Axiron, one swipe to axilla daily  -RTC in 6 months for HCT, testosterone, PSA, LFT's, ADAM and exam  2. BPH with LUTS  - IPSS score is 5/1, it is stable  - Continue conservative management, avoiding bladder irritants and timed voiding's  - RTC in 6 months for IPSS, PSA and exam, as testosterone therapy can cause prostate enlargement and worsen LUTS  3. Erectile dysfunction:     -SHIM score is 18  - continue Viagra and Cialis  -RTC in 6 months for SHIM score and exam, as testosterone therapy can affect erections   4. SOB  - patient is having episodes of SOB during sexual activity when he takes the sildenafil - HCT was normal in 06/2016- recheck today  - stress test five year ago was normal  - suggest follow up with Dr. Sabra Heck for more evaluation for this symptomatology   5. Nocturia  - I explained to the patient that nocturia is often multi-factorial and difficult to treat.  Sleeping disorders, heart conditions, peripheral vascular disease, diabetes, an enlarged prostate for men, an urethral stricture causing bladder outlet obstruction and/or certain medications can contribute to nocturia.  - I have suggested that the patient avoid caffeine after noon and alcohol in the evening.  He or she may also benefit from fluid restrictions after 6:00 in the evening and voiding just prior to bedtime.  - I have explained that research studies have showed that over 84% of  patients with sleep apnea reported frequent nighttime urination.   With sleep apnea, oxygen decreases, carbon dioxide increases, the blood become more acidic, the heart rate drops and blood vessels in the lung constrict.  The body is then alerted that something is very wrong. The sleeper must wake enough to reopen the airway. By this time, the heart is racing and experiences a false signal of fluid overload. The heart excretes a hormone-like protein that tells the body to get rid of sodium and water, resulting in nocturia.  -  I also informed the patient that a recent study noted that decreasing sodium intake to 2.3 grams daily, if they don't have issues with hyponatremia, can also reduce the number of nightly voids  - The patient may benefit from a discussion with his or her primary care physician to see if he or she has risk factors for sleep apnea or other sleep disturbances and obtaining a sleep study.  Return in  about 6 months (around 04/23/2017) for ADAM, IPSS, SHIM and exam, PSA, LFT's, HCT, testosterone .  Zara Council, [facility] [address], [place], [place] [address] [phone]

## 2016-10-24 ENCOUNTER — Encounter: Payer: Self-pay | Admitting: Urology

## 2016-10-24 ENCOUNTER — Other Ambulatory Visit: Payer: Self-pay | Admitting: Urology

## 2016-10-24 ENCOUNTER — Ambulatory Visit: Payer: BLUE CROSS/BLUE SHIELD | Admitting: Urology

## 2016-10-24 VITALS — BP 138/80 | HR 58 | Ht 70.0 in | Wt 215.3 lb

## 2016-10-24 DIAGNOSIS — R0602 Shortness of breath: Secondary | ICD-10-CM

## 2016-10-24 DIAGNOSIS — R351 Nocturia: Secondary | ICD-10-CM

## 2016-10-24 DIAGNOSIS — N401 Enlarged prostate with lower urinary tract symptoms: Secondary | ICD-10-CM

## 2016-10-24 DIAGNOSIS — N138 Other obstructive and reflux uropathy: Secondary | ICD-10-CM | POA: Diagnosis not present

## 2016-10-24 DIAGNOSIS — N200 Calculus of kidney: Secondary | ICD-10-CM | POA: Insufficient documentation

## 2016-10-24 DIAGNOSIS — R972 Elevated prostate specific antigen [PSA]: Secondary | ICD-10-CM

## 2016-10-24 DIAGNOSIS — E291 Testicular hypofunction: Secondary | ICD-10-CM

## 2016-10-24 MED ORDER — TESTOSTERONE 30 MG/ACT TD SOLN: TRANSDERMAL | 5 refills | Status: DC

## 2016-10-25 ENCOUNTER — Telehealth: Payer: Self-pay

## 2016-10-25 LAB — HEPATIC FUNCTION PANEL
ALBUMIN: 4.1 g/dL (ref 3.6–4.8)
ALT: 16 IU/L (ref 0–44)
AST: 16 IU/L (ref 0–40)
Alkaline Phosphatase: 49 IU/L (ref 39–117)
BILIRUBIN TOTAL: 0.6 mg/dL (ref 0.0–1.2)
BILIRUBIN, DIRECT: 0.14 mg/dL (ref 0.00–0.40)
Total Protein: 6.4 g/dL (ref 6.0–8.5)

## 2016-10-25 LAB — HEMATOCRIT: HEMATOCRIT: 45.8 % (ref 37.5–51.0)

## 2016-10-25 LAB — TESTOSTERONE: TESTOSTERONE: 520 ng/dL (ref 264–916)

## 2016-10-25 LAB — PSA: Prostate Specific Ag, Serum: 1.2 ng/mL (ref 0.0–4.0)

## 2016-10-25 NOTE — Telephone Encounter (Signed)
Sent message via mychart

## 2016-10-25 NOTE — Telephone Encounter (Signed)
-----   Message from Nori Riis, PA-C sent at 10/25/2016  8:07 AM EST ----- Please notify the patient that his labs were good and we will see him in 6 months.

## 2016-10-30 ENCOUNTER — Encounter: Payer: Self-pay | Admitting: Urology

## 2016-11-07 ENCOUNTER — Encounter: Payer: Self-pay | Admitting: Urology

## 2016-11-08 ENCOUNTER — Telehealth: Payer: Self-pay | Admitting: Urology

## 2016-11-08 NOTE — Telephone Encounter (Signed)
Medication was called into pharmacy and pt was made aware via mychart.

## 2016-11-08 NOTE — Telephone Encounter (Signed)
Would you call this in for the patient? There is a pharmacy near [employer] [phone]) - that compounds the active ingredient Tadalafil into 2.5mg  troches that dissolve in the mouth.  They will make 30 at a time for $38. He would like 60 troches.

## 2016-12-13 ENCOUNTER — Encounter: Payer: Self-pay | Admitting: Urology

## 2016-12-14 ENCOUNTER — Telehealth: Payer: Self-pay | Admitting: Urology

## 2016-12-14 DIAGNOSIS — E291 Testicular hypofunction: Secondary | ICD-10-CM

## 2016-12-14 MED ORDER — CLOMIPHENE CITRATE 50 MG PO TABS: ORAL_TABLET | ORAL | 0 refills | Status: DC

## 2016-12-14 NOTE — Telephone Encounter (Signed)
Medication sent to pharmacy. Will make pt aware of lab appt.  LMOM

## 2016-12-14 NOTE — Telephone Encounter (Signed)
Please call the patient and ask where he would like Clomid to be called into.  Clomid 50 mg, 1/2 tablet daily, # 30.  Also, he will need a lab visit in one month for a testosterone before 10 AM.

## 2017-01-21 ENCOUNTER — Other Ambulatory Visit: Payer: BLUE CROSS/BLUE SHIELD

## 2017-01-21 DIAGNOSIS — E291 Testicular hypofunction: Secondary | ICD-10-CM

## 2017-01-22 ENCOUNTER — Telehealth: Payer: Self-pay

## 2017-01-22 LAB — TESTOSTERONE: Testosterone: 542 ng/dL (ref 264–916)

## 2017-01-22 NOTE — Telephone Encounter (Signed)
-----   Message from Nori Riis, PA-C sent at 01/22/2017  8:21 AM EDT ----- Please let [name] know that his testosterone level is great!  We will see him in August.

## 2017-01-22 NOTE — Telephone Encounter (Signed)
Spoke with pt in reference to lab results. Pt voiced understanding.  

## 2017-02-08 ENCOUNTER — Encounter: Payer: Self-pay | Admitting: Urology

## 2017-02-08 ENCOUNTER — Other Ambulatory Visit: Payer: Self-pay

## 2017-02-08 DIAGNOSIS — E291 Testicular hypofunction: Secondary | ICD-10-CM

## 2017-02-08 MED ORDER — CLOMIPHENE CITRATE 50 MG PO TABS: ORAL_TABLET | ORAL | 3 refills | Status: DC

## 2017-03-25 ENCOUNTER — Other Ambulatory Visit: Payer: Self-pay

## 2017-03-25 MED ORDER — TADALAFIL 2.5 MG PO TABS: ORAL_TABLET | ORAL | 0 refills | Status: DC

## 2017-03-28 DIAGNOSIS — Z8249 Family history of ischemic heart disease and other diseases of the circulatory system: Secondary | ICD-10-CM | POA: Insufficient documentation

## 2017-03-28 DIAGNOSIS — E538 Deficiency of other specified B group vitamins: Secondary | ICD-10-CM | POA: Insufficient documentation

## 2017-04-22 ENCOUNTER — Other Ambulatory Visit: Payer: BLUE CROSS/BLUE SHIELD

## 2017-04-22 DIAGNOSIS — R972 Elevated prostate specific antigen [PSA]: Secondary | ICD-10-CM

## 2017-04-22 DIAGNOSIS — E291 Testicular hypofunction: Secondary | ICD-10-CM

## 2017-04-23 ENCOUNTER — Other Ambulatory Visit: Payer: BLUE CROSS/BLUE SHIELD

## 2017-04-23 LAB — TESTOSTERONE: TESTOSTERONE: 465 ng/dL (ref 264–916)

## 2017-04-23 LAB — HEPATIC FUNCTION PANEL
ALBUMIN: 4.2 g/dL (ref 3.6–4.8)
ALT: 19 IU/L (ref 0–44)
AST: 19 IU/L (ref 0–40)
Alkaline Phosphatase: 42 IU/L (ref 39–117)
Bilirubin Total: 0.4 mg/dL (ref 0.0–1.2)
Bilirubin, Direct: 0.1 mg/dL (ref 0.00–0.40)
Total Protein: 6.2 g/dL (ref 6.0–8.5)

## 2017-04-23 LAB — HEMATOCRIT: Hematocrit: 43.2 % (ref 37.5–51.0)

## 2017-04-23 LAB — PSA: PROSTATE SPECIFIC AG, SERUM: 1.5 ng/mL (ref 0.0–4.0)

## 2017-04-24 DIAGNOSIS — E349 Endocrine disorder, unspecified: Secondary | ICD-10-CM | POA: Insufficient documentation

## 2017-04-24 NOTE — Progress Notes (Signed)
8:45 AM   Raymond Flowers 04/06/1956 [id number]  Referring provider: Rusty Aus, MD Raymond Flowers [place], [place] [address]  Chief Complaint  Patient presents with  . Hypogonadism    6 month follow up  . Benign Prostatic Hypertrophy    HPI: [name] is a 61 year old Caucasian male with erectile dysfunction, testosterone deficiency and BPH with LUTS who presents today for 6 month follow-up.  Testosterone deficiency Patient is experiencing erections being less strong.  This is indicated by his responses to the ADAM questionnaire.  He is still having spontaneous erections at night.  He does not have sleep apnea.   He is reporting obesity.  His current testosterone level was 465 ng/dL on 04/22/2017.   He is currently managing his testosterone deficiency with Clomid 50 mg, 1/2 tablet daily.       Androgen Deficiency in the Aging Male    Raymond Flowers Flowers 04/25/17 0800         Androgen Deficiency in the Aging Male   Do you have a decrease in libido (sex drive) No     Do you have lack of energy No     Do you have a decrease in strength and/or endurance No     Have you lost height No     Have you noticed a decreased "enjoyment of life" No     Are you sad and/or grumpy No     Are your erections less strong Yes     Have you noticed a recent deterioration in your ability to play sports No     Are you falling asleep after dinner No     Has there been a recent deterioration in your work performance No         Erectile dysfunction Patient's SHIM score is 19 , which is mild erectile dysfunction.  His previous SHIM score was 18.  He is currently managing his erectile dysfunction with Viagra 100 mg.  He denies any penile curvature or pain with erections, but has been experiencing a loss of erections during intercourse.         [place] Flowers 04/25/17 [phone]         SHIM: Over the last 6 months:   How do you rate your confidence that  you could get and keep an erection? Moderate     When you had erections with sexual stimulation, how often were your erections hard enough for penetration (entering your partner)? Almost Always or Always     During sexual intercourse, how often were you able to maintain your erection after you had penetrated (entered) your partner? Sometimes (about half the time)     During sexual intercourse, how difficult was it to maintain your erection to completion of intercourse? Difficult     When you attempted sexual intercourse, how often was it satisfactory for you? Almost Always or Always       SHIM Total Score   SHIM 19        Score: 1-7 Severe ED 8-11 Moderate ED 12-16 Mild-Moderate ED 17-21 Mild ED 22-25 No ED    BPH with LUTS His IPSS score today is 4, which is mild lower urinary tract symptomatology. He is pleased with his with his life due to his urinary symptoms.  His previous IPSS score was 5/0.  His main complaint today is nocturia x 3.  He is not experiencing dysuria, gross hematuria or suprapubic  pain. He also denies any recent fevers, chills, nausea, vomiting or urinary tract infections.     IPSS    Raymond Flowers 04/25/17 0800         International Prostate Symptom Score   How often have you had the sensation of not emptying your bladder? Not at All     How often have you had to urinate less than every two hours? Less than half the time     How often have you found you stopped and started again several times when you urinated? Not at All     How often have you found it difficult to postpone urination? Not at All     How often have you had a weak urinary stream? Not at All     How often have you had to strain to start urination? Not at All     How many times did you typically get up at night to urinate? 2 Times     Total IPSS Score 4       Quality of Life due to urinary symptoms   If you were to spend the rest of your life with your urinary condition just the way it is now how  would you feel about that? Pleased        Score:  1-7 Mild 8-19 Moderate 20-35 Severe  PMH: Past Medical History:  Diagnosis Date  . Androgen deficiency   . Benign enlargement of prostate   . Blood in semen   . BPH (benign prostatic hyperplasia)   . Erectile dysfunction   . Hypertension   . Impotence   . Over weight     Surgical History: Past Surgical History:  Procedure Laterality Date  . KNEE ARTHROSCOPY Right 2016  . SHOULDER SURGERY  2009   right  shoulder x 2  ([address])  . TONSILLECTOMY    . tricep surgery Left 2015  . VARICOCELE EXCISION      Home Medications:  Allergies as of 04/25/2017      Reactions   Codeine Other (See Comments)   Reaction:  Hallucinations      Medication List       Accurate as of 04/25/17  8:45 AM. Always use your most recent med list.          aspirin 81 MG tablet Take 81 mg by mouth daily.   clomiPHENE 50 MG tablet Commonly known as:  CLOMID 1/2 tab daily   [facility] GLUCOSAMINE-CHONDROITIN 500-400 MG tablet Generic drug:  glucosamine-chondroitin Take by mouth.   cyclobenzaprine 10 MG tablet Commonly known as:  FLEXERIL Take by mouth.   diphenhydramine-acetaminophen 25-500 MG Tabs tablet Commonly known as:  TYLENOL PM Take by mouth.   Fish Oil 1000 MG Caps Take by mouth. Reported on 10/17/2015   GREEN TEA PO Take by mouth.   hydrochlorothiazide 25 MG tablet Commonly known as:  HYDRODIURIL TK 1 T PO QD   HYDROMET 5-1.5 MG/5ML syrup Generic drug:  HYDROcodone-homatropine TK 5 M L PO TID   naproxen 500 MG tablet Commonly known as:  NAPROSYN Reported on 10/17/2015   sildenafil 20 MG tablet Commonly known as:  REVATIO Take 3 to 5 tablets two hours before intercouse on an empty stomach.  Do not take with nitrates.   tadalafil 5 MG tablet Commonly known as:  CIALIS Take one tablet daily   Tadalafil 2.5 MG Tabs Dissolve (1) Troche by mouth as needed (1) hour prior to sexual activity   Testosterone 30 MG/ACT  Soln Commonly known as:  AXIRON APPLY 1 PUMP UNDER EACH ARM ONCE DAILY   TRANSDERM-SCOP (1.5 MG) 1 MG/3DAYS Generic drug:  scopolamine APPLY 1 PATCH TO SKIN Q 3 DAYS   TUMERSAID Tabs Take by mouth.   vitamin B-12 1000 MCG tablet Commonly known as:  CYANOCOBALAMIN Take by mouth.       Allergies:  Allergies  Allergen Reactions  . Codeine Other (See Comments)    Reaction:  Hallucinations     Family History: Family History  Problem Relation Age of Onset  . Heart disease Mother 38  . Hypertension Mother   . Heart disease Father 86       MI   . Kidney Stones Father        mother  . Hypertension Brother   . Prostate cancer Neg Hx   . Kidney disease Neg Hx   . Kidney cancer Neg Hx   . Bladder Cancer Neg Hx     Social History:  reports that he has never smoked. He has never used smokeless tobacco. He reports that he drinks about 1.2 oz of alcohol per week . He reports that he does not use drugs.  ROS: UROLOGY Frequent Urination?: No Hard to postpone urination?: No Burning/pain with urination?: No Get up at night to urinate?: No Leakage of urine?: No Urine stream starts and stops?: No Trouble starting stream?: No Do you have to strain to urinate?: No Blood in urine?: No Urinary tract infection?: No Sexually transmitted disease?: No Injury to kidneys or bladder?: No Painful intercourse?: No Weak stream?: No Erection problems?: No Penile pain?: No  Gastrointestinal Nausea?: No Vomiting?: No Indigestion/heartburn?: No Diarrhea?: No Constipation?: No  Constitutional Fever: No Night sweats?: No Weight loss?: No Fatigue?: No  Skin Skin rash/lesions?: No Itching?: No  Eyes Blurred vision?: No Double vision?: No  Ears/Nose/Throat Sore throat?: No Sinus problems?: No  Hematologic/Lymphatic Swollen glands?: No Easy bruising?: No  Cardiovascular Leg swelling?: No Chest pain?: No  Respiratory Cough?: No Shortness of breath?:  No  Endocrine Excessive thirst?: No  Musculoskeletal Back pain?: No Joint pain?: No  Neurological Headaches?: No Dizziness?: No  Psychologic Depression?: No Anxiety?: No  Physical Exam: BP 125/78   Pulse 70   Ht 5\' 10"  (1.778 m)   Wt 210 lb 9.6 oz (95.5 kg)   BMI 30.22 kg/m   Constitutional: Well nourished. Alert and oriented, No acute distress. HEENT: [place] AT, moist mucus membranes. Trachea midline, no masses. Cardiovascular: No clubbing, cyanosis, or edema. Respiratory: Normal respiratory effort, no increased work of breathing. GI: Abdomen is soft, non tender, non distended, no abdominal masses. Liver and spleen not palpable.  No hernias appreciated.  Stool sample for occult testing is not indicated.   GU: No CVA tenderness.  No bladder fullness or masses.  Patient with circumcised phallus.  Urethral meatus is patent.  No penile discharge. No penile lesions or rashes. Scrotum without lesions, cysts, rashes and/or edema.  Testicles are located scrotally bilaterally. No masses are appreciated in the testicles. Left and right epididymis are normal. Rectal: Patient with  normal sphincter tone. Anus and perineum without scarring or rashes. No rectal masses are appreciated. Prostate is approximately 45 grams, no nodules are appreciated. Seminal vesicles are normal. Skin: No rashes, bruises or suspicious lesions. Lymph: No cervical or inguinal adenopathy. Neurologic: Grossly intact, no focal deficits, moving all 4 extremities. Psychiatric: Normal mood and affect.   Laboratory Data: Results for orders placed or performed in visit on 04/22/17  PSA  Result Value Ref Range   Prostate Specific Ag, Serum 1.5 0.0 - 4.0 ng/mL  Testosterone  Result Value Ref Range   Testosterone 465 264 - 916 ng/dL  Hematocrit  Result Value Ref Range   Hematocrit 43.2 37.5 - 51.0 %  Hepatic function panel  Result Value Ref Range   Total Protein 6.2 6.0 - 8.5 g/dL   Albumin 4.2 3.6 - 4.8 g/dL    Bilirubin Total 0.4 0.0 - 1.2 mg/dL   Bilirubin, Direct 0.10 0.00 - 0.40 mg/dL   Alkaline Phosphatase 42 39 - 117 IU/L   AST 19 0 - 40 IU/L   ALT 19 0 - 44 IU/L   Lab Results  Component Value Date   HGB 17.8 03/10/2014   HCT 43.2 04/22/2017   Lab Results  Component Value Date   CREATININE 1.17 04/21/2015   Lab Results  Component Value Date   TESTOSTERONE 465 04/22/2017  PSA history:     1.1 ng/mL on 09/03/2013     1.2 ng/mL on 03/04/2014     1.2 ng/mL on 10/04/2014     1.6 ng/mL  on  04/13/2015     1.4 ng/mL on 10/13/2015  1.2 ng/mL on 07/24/2016  I have reviewed the labs  Assessment & Plan:    1. Testosterone deficiency    -most recent testosterone level is 465 ng/dL on 04/22/2017  -continue Clomid 50 mg, 1/2 tablet daily ; refill given  -RTC in 6 months for HCT, testosterone, PSA, LFT's, ADAM and exam  2. BPH with LUTS  - IPSS score is 4/1, it is improved  - Continue conservative management, avoiding bladder irritants and timed voiding's  - RTC in 6 months for IPSS, PSA and exam, as testosterone therapy can cause prostate enlargement and worsen LUTS  3. Erectile dysfunction:     -SHIM score is 19, it is improved  - continue Viagra and Cialis  -RTC in 6 months for SHIM score and exam, as testosterone therapy can affect erections    Return in about 6 months (around 10/26/2017) for PSA, LFT's, HCT/HBG, testosterone (before 10 AM), ADAM, IPSS, SHIM and exam.  Zara Council, [facility]  [facility] [address], [place], [place] [address] [phone]

## 2017-04-25 ENCOUNTER — Encounter: Payer: Self-pay | Admitting: Urology

## 2017-04-25 ENCOUNTER — Ambulatory Visit (INDEPENDENT_AMBULATORY_CARE_PROVIDER_SITE_OTHER): Payer: BLUE CROSS/BLUE SHIELD | Admitting: Urology

## 2017-04-25 VITALS — BP 125/78 | HR 70 | Ht 70.0 in | Wt 210.6 lb

## 2017-04-25 DIAGNOSIS — N401 Enlarged prostate with lower urinary tract symptoms: Secondary | ICD-10-CM

## 2017-04-25 DIAGNOSIS — E291 Testicular hypofunction: Secondary | ICD-10-CM | POA: Diagnosis not present

## 2017-04-25 DIAGNOSIS — N529 Male erectile dysfunction, unspecified: Secondary | ICD-10-CM

## 2017-04-25 DIAGNOSIS — N138 Other obstructive and reflux uropathy: Secondary | ICD-10-CM

## 2017-04-25 DIAGNOSIS — E349 Endocrine disorder, unspecified: Secondary | ICD-10-CM

## 2017-04-25 MED ORDER — CLOMIPHENE CITRATE 50 MG PO TABS: ORAL_TABLET | ORAL | 3 refills | Status: DC

## 2017-04-25 MED ORDER — SILDENAFIL CITRATE 20 MG PO TABS: ORAL_TABLET | ORAL | 3 refills | Status: DC

## 2017-05-06 ENCOUNTER — Telehealth: Payer: Self-pay

## 2017-05-06 ENCOUNTER — Encounter: Payer: Self-pay | Admitting: Urology

## 2017-05-06 NOTE — Telephone Encounter (Signed)
Pt sent a mychart message requesting medications be removed from his list. Requested meds were removed and pt made aware.

## 2017-09-25 ENCOUNTER — Encounter: Payer: Self-pay | Admitting: Urology

## 2017-09-25 ENCOUNTER — Telehealth: Payer: Self-pay | Admitting: Urology

## 2017-09-25 NOTE — Telephone Encounter (Signed)
Patient is having some issues with Clomid.  Would you move his appointment up to sometime in January?  He will need morning labs (testosterone before 10 am, LFT's, HCT/HBG and PSA) prior to the appointment.

## 2017-10-01 NOTE — Telephone Encounter (Signed)
Done ° ° °Michelle °

## 2017-10-02 ENCOUNTER — Other Ambulatory Visit: Payer: BLUE CROSS/BLUE SHIELD

## 2017-10-02 DIAGNOSIS — E291 Testicular hypofunction: Secondary | ICD-10-CM

## 2017-10-02 DIAGNOSIS — N401 Enlarged prostate with lower urinary tract symptoms: Secondary | ICD-10-CM

## 2017-10-03 LAB — HEPATIC FUNCTION PANEL
ALT: 16 IU/L (ref 0–44)
AST: 24 IU/L (ref 0–40)
Albumin: 4.4 g/dL (ref 3.6–4.8)
Alkaline Phosphatase: 46 IU/L (ref 39–117)
BILIRUBIN TOTAL: 0.4 mg/dL (ref 0.0–1.2)
BILIRUBIN, DIRECT: 0.11 mg/dL (ref 0.00–0.40)
TOTAL PROTEIN: 7.2 g/dL (ref 6.0–8.5)

## 2017-10-03 LAB — HEMATOCRIT: HEMATOCRIT: 47 % (ref 37.5–51.0)

## 2017-10-03 LAB — PSA: Prostate Specific Ag, Serum: 1.3 ng/mL (ref 0.0–4.0)

## 2017-10-03 LAB — TESTOSTERONE: TESTOSTERONE: 415 ng/dL (ref 264–916)

## 2017-10-03 LAB — HEMOGLOBIN: Hemoglobin: 16.3 g/dL (ref 13.0–17.7)

## 2017-10-09 NOTE — Progress Notes (Addendum)
8:56 AM   Raymond Flowers [birth year] [id number]  Referring provider: Rusty Aus, MD [place] [facility] [place],  [id number]  Chief Complaint  Patient presents with  . Follow-up  . Erectile Dysfunction  . Benign Prostatic Hypertrophy    HPI: [name] is a 62 year old Caucasian male with erectile dysfunction, testosterone deficiency and BPH with LUTS who presents today for 6 month follow-up.  Testosterone deficiency Patient is experiencing a decrease in libido, a lack of energy, a decrease in strength and/or endurance, erections being less strong and falling asleep after dinner.  This is indicated by his responses to the ADAM questionnaire.  He is still having spontaneous erections at night.  He does not have sleep apnea.   He is reporting obesity.  His current testosterone level was 415 ng/dL on 10/02/2017.   HCT, Hbg and LFT's are normal.  He is currently managing his testosterone deficiency with Clomid 50 mg, 1/2 tablet daily.   Androgen Deficiency in the Aging Male    [name] 10/10/17 0800         Androgen Deficiency in the Aging Male   Do you have a decrease in libido (sex drive)  Yes     Do you have lack of energy  Yes     Do you have a decrease in strength and/or endurance  Yes     Have you lost height  No     Have you noticed a decreased "enjoyment of life"  No     Are you sad and/or grumpy  No     Are your erections less strong  Yes     Have you noticed a recent deterioration in your ability to play sports  No     Are you falling asleep after dinner  Yes     Has there been a recent deterioration in your work performance  No         Erectile dysfunction Patient's SHIM score is 21,  which is mild erectile dysfunction.  His previous SHIM score was 18.  He is currently managing his erectile dysfunction with Viagra 100 mg.  He denies any penile curvature or pain with erections, but has been experiencing a loss of  erections during intercourse.     SHIM    [place] Name 10/10/17 0840         SHIM: Over the last 6 months:   How do you rate your confidence that you could get and keep an erection?  Moderate     When you had erections with sexual stimulation, how often were your erections hard enough for penetration (entering your partner)?  Almost Always or Always     During sexual intercourse, how often were you able to maintain your erection after you had penetrated (entered) your partner?  Most Times (much more than half the time)     During sexual intercourse, how difficult was it to maintain your erection to completion of intercourse?  Slightly Difficult     When you attempted sexual intercourse, how often was it satisfactory for you?  Almost Always or Always       SHIM Total Score   SHIM  21        Score: 1-7 Severe ED 8-11 Moderate ED 12-16 Mild-Moderate ED 17-21 Mild ED 22-25 No ED  BPH with LUTS His IPSS score today is 6, which is mild lower urinary tract symptomatology. He is pleased with his life  due to his urinary symptoms.  His previous IPSS score was 4/1.  His main complaint today is nocturia x 3.  He is not experiencing dysuria, gross hematuria or suprapubic pain. He also denies any recent fevers, chills, nausea, vomiting or urinary tract infections. IPSS    [place] Name 10/10/17 0800         International Prostate Symptom Score   How often have you had the sensation of not emptying your bladder?  Not at All     How often have you had to urinate less than every two hours?  About half the time     How often have you found you stopped and started again several times when you urinated?  Not at All     How often have you found it difficult to postpone urination?  Not at All     How often have you had a weak urinary stream?  Not at All     How often have you had to strain to start urination?  Not at All     How many times did you typically get up at night to urinate?  3 Times     Total  IPSS Score  6       Quality of Life due to urinary symptoms   If you were to spend the rest of your life with your urinary condition just the way it is now how would you feel about that?  Pleased        Score:  1-7 Mild 8-19 Moderate 20-35 Severe  Of note, patient went to a "men's clinic" and was injected in the penis to see how long his erection would like to check for "venous leakage" and was told he was only at 70%.  He then ended up with a priapism and had to be injected five times to relieve the priapism.    PMH: Past Medical History:  Diagnosis Date  . Androgen deficiency   . Benign enlargement of prostate   . Blood in semen   . BPH (benign prostatic hyperplasia)   . Erectile dysfunction   . Hypertension   . Impotence   . Over weight     Surgical History: Past Surgical History:  Procedure Laterality Date  . KNEE ARTHROSCOPY Right 2016  . SHOULDER SURGERY  2009   right  shoulder x 2  ([address])  . TONSILLECTOMY    . tricep surgery Left 2015  . VARICOCELE EXCISION      Home Medications:  Allergies as of 10/10/2017      Reactions   Codeine Other (See Comments)   Reaction:  Hallucinations      Medication List        Accurate as of 10/10/17  8:56 AM. Always use your most recent med list.          aspirin 81 MG tablet Take 81 mg by mouth daily.   clomiPHENE 50 MG tablet Commonly known as:  CLOMID 1/2 tab daily   Fish Oil 1000 MG Caps Take by mouth. Reported on 10/17/2015   hydrochlorothiazide 25 MG tablet Commonly known as:  HYDRODIURIL TK 1 T PO QD   naproxen 500 MG tablet Commonly known as:  NAPROSYN Reported on 10/17/2015   sildenafil 20 MG tablet Commonly known as:  REVATIO Take 3 to 5 tablets two hours before intercouse on an empty stomach.  Do not take with nitrates.   Tadalafil 2.5 MG Tabs Dissolve (1) Troche by mouth as needed (1)  hour prior to sexual activity   TRANSDERM-SCOP (1.5 MG) 1 MG/3DAYS Generic drug:  scopolamine APPLY 1 PATCH  TO SKIN Q 3 DAYS   TUMERSAID Tabs Take by mouth.   vitamin B-12 1000 MCG tablet Commonly known as:  CYANOCOBALAMIN Take by mouth.       Allergies:  Allergies  Allergen Reactions  . Codeine Other (See Comments)    Reaction:  Hallucinations     Family History: Family History  Problem Relation Age of Onset  . Heart disease Mother 91  . Hypertension Mother   . Heart disease Father 94       MI   . Kidney Stones Father        mother  . Hypertension Brother   . Prostate cancer Neg Hx   . Kidney disease Neg Hx   . Kidney cancer Neg Hx   . Bladder Cancer Neg Hx     Social History:  reports that  has never smoked. he has never used smokeless tobacco. He reports that he drinks about 1.2 oz of alcohol per week. He reports that he does not use drugs.  ROS: UROLOGY Frequent Urination?: No Hard to postpone urination?: No Burning/pain with urination?: No Get up at night to urinate?: No Leakage of urine?: No Urine stream starts and stops?: No Trouble starting stream?: No Do you have to strain to urinate?: No Blood in urine?: No Urinary tract infection?: No Sexually transmitted disease?: No Injury to kidneys or bladder?: No Painful intercourse?: No Weak stream?: No Erection problems?: No Penile pain?: No  Gastrointestinal Nausea?: No Vomiting?: No Indigestion/heartburn?: No Diarrhea?: No Constipation?: No  Constitutional Fever: No Night sweats?: No Weight loss?: No Fatigue?: No  Skin Skin rash/lesions?: No Itching?: No  Eyes Blurred vision?: No Double vision?: No  Ears/Nose/Throat Sore throat?: No Sinus problems?: No  Hematologic/Lymphatic Swollen glands?: No Easy bruising?: No  Cardiovascular Leg swelling?: No Chest pain?: No  Respiratory Cough?: No Shortness of breath?: No  Endocrine Excessive thirst?: No  Musculoskeletal Back pain?: No Joint pain?: No  Neurological Headaches?: No Dizziness?: No  Psychologic Depression?:  No Anxiety?: No  Physical Exam: BP 137/82   Pulse 61   Ht 5\' 10"  (1.778 m)   Wt 209 lb (94.8 kg)   BMI 29.99 kg/m   Constitutional: Well nourished. Alert and oriented, No acute distress. HEENT: [place] AT, moist mucus membranes. Trachea midline, no masses. Cardiovascular: No clubbing, cyanosis, or edema. Respiratory: Normal respiratory effort, no increased work of breathing. GI: Abdomen is soft, non tender, non distended, no abdominal masses. Liver and spleen not palpable.  No hernias appreciated.  Stool sample for occult testing is not indicated.   GU: No CVA tenderness.  No bladder fullness or masses.  Patient with circumcised phallus.  Urethral meatus is patent.  No penile discharge. No penile lesions or rashes. Scrotum without lesions, cysts, rashes and/or edema.  Testicles are located scrotally bilaterally. No masses are appreciated in the testicles. Left and right epididymis are normal. Rectal: Patient with  normal sphincter tone. Anus and perineum without scarring or rashes. No rectal masses are appreciated. Prostate is approximately 45 grams, no nodules are appreciated. Seminal vesicles are normal. Skin: No rashes, bruises or suspicious lesions. Lymph: No cervical or inguinal adenopathy. Neurologic: Grossly intact, no focal deficits, moving all 4 extremities. Psychiatric: Normal mood and affect.   Laboratory Data: Results for orders placed or performed in visit on 10/02/17  Hemoglobin  Result Value Ref Range   Hemoglobin 16.3  13.0 - 17.7 g/dL  Hematocrit  Result Value Ref Range   Hematocrit 47.0 37.5 - 51.0 %  Hepatic function panel  Result Value Ref Range   Total Protein 7.2 6.0 - 8.5 g/dL   Albumin 4.4 3.6 - 4.8 g/dL   Bilirubin Total 0.4 0.0 - 1.2 mg/dL   Bilirubin, Direct 0.11 0.00 - 0.40 mg/dL   Alkaline Phosphatase 46 39 - 117 IU/L   AST 24 0 - 40 IU/L   ALT 16 0 - 44 IU/L  Testosterone  Result Value Ref Range   Testosterone 415 264 - 916 ng/dL  PSA  Result  Value Ref Range   Prostate Specific Ag, Serum 1.3 0.0 - 4.0 ng/mL   Lab Results  Component Value Date   HGB 16.3 10/02/2017   HCT 47.0 10/02/2017   Lab Results  Component Value Date   CREATININE 1.17 04/21/2015   Lab Results  Component Value Date   TESTOSTERONE 415 10/02/2017  PSA history:     1.1 ng/mL on 09/03/2013     1.2 ng/mL on 03/04/2014     1.2 ng/mL on 10/04/2014     Component     Latest Ref Rng & Units 04/12/2015 10/13/2015 03/26/2016 07/24/2016  Prostate Specific Ag, Serum     0.0 - 4.0 ng/mL 1.6 1.4 1.4 1.2   Component     Latest Ref Rng & Units 10/24/2016 04/22/2017 10/02/2017  Prostate Specific Ag, Serum     0.0 - 4.0 ng/mL 1.2 1.5 1.3   I have reviewed the labs  Assessment & Plan:    1. Testosterone deficiency    -most recent testosterone level is 415 ng/dL on 10/02/2017  -continue Clomid 50 mg, increase to 1 tablet daily; prescription sent to pharmacy - check testosterone level in the am (before 10 am)  in one month  -RTC in 6 months for HCT, testosterone, PSA, LFT's, ADAM and exam  2. BPH with LUTS  - IPSS score is 6/1, it is slightly worse improved  - Continue conservative management, avoiding bladder irritants and timed voiding's  - RTC in 6 months for IPSS, PSA and exam, as testosterone therapy can cause prostate enlargement and worsen LUTS  3. Erectile dysfunction:     -SHIM score is 21, it is improved  - continue Viagra and Cialis  -RTC in 6 months for SHIM score and exam, as testosterone therapy can affect erections    Return in about 1 month (around 11/10/2017) for testosterone level before 10 am.  Zara Council, PA-C  [facility] [address], [place], [place] [address] [phone]

## 2017-10-10 ENCOUNTER — Encounter: Payer: Self-pay | Admitting: Urology

## 2017-10-10 ENCOUNTER — Ambulatory Visit: Payer: BLUE CROSS/BLUE SHIELD | Admitting: Urology

## 2017-10-10 VITALS — BP 137/82 | HR 61 | Ht 70.0 in | Wt 209.0 lb

## 2017-10-10 DIAGNOSIS — N529 Male erectile dysfunction, unspecified: Secondary | ICD-10-CM | POA: Diagnosis not present

## 2017-10-10 DIAGNOSIS — N401 Enlarged prostate with lower urinary tract symptoms: Secondary | ICD-10-CM | POA: Diagnosis not present

## 2017-10-10 DIAGNOSIS — N138 Other obstructive and reflux uropathy: Secondary | ICD-10-CM | POA: Diagnosis not present

## 2017-10-10 DIAGNOSIS — E291 Testicular hypofunction: Secondary | ICD-10-CM | POA: Diagnosis not present

## 2017-10-10 DIAGNOSIS — E349 Endocrine disorder, unspecified: Secondary | ICD-10-CM

## 2017-10-10 MED ORDER — CLOMIPHENE CITRATE 50 MG PO TABS
ORAL_TABLET | ORAL | 3 refills | Status: DC
Start: 2017-10-10 — End: 2018-03-10

## 2017-11-04 ENCOUNTER — Other Ambulatory Visit: Payer: BLUE CROSS/BLUE SHIELD

## 2017-11-06 ENCOUNTER — Ambulatory Visit: Payer: BLUE CROSS/BLUE SHIELD | Admitting: Urology

## 2017-11-13 ENCOUNTER — Other Ambulatory Visit: Payer: BLUE CROSS/BLUE SHIELD

## 2017-11-13 DIAGNOSIS — E291 Testicular hypofunction: Secondary | ICD-10-CM

## 2017-11-14 ENCOUNTER — Telehealth: Payer: Self-pay

## 2017-11-14 DIAGNOSIS — E291 Testicular hypofunction: Secondary | ICD-10-CM

## 2017-11-14 LAB — TESTOSTERONE: TESTOSTERONE: 475 ng/dL (ref 264–916)

## 2017-11-14 NOTE — Telephone Encounter (Signed)
Made aware via mychart and letter.

## 2017-11-14 NOTE — Telephone Encounter (Signed)
-----   Message from Nori Riis, PA-C sent at 11/14/2017  7:51 AM EST ----- Please let [name] know that his testosterone is now in the therapeutic range.  He will need follow up in 6 months with labs drawn prior (PSA, testosterone (before 10 am), LFT's, Hbg and HCT to be drawn before appointment

## 2017-12-30 ENCOUNTER — Other Ambulatory Visit: Payer: Self-pay | Admitting: Urology

## 2018-01-08 ENCOUNTER — Encounter: Payer: Self-pay | Admitting: Urology

## 2018-01-13 ENCOUNTER — Other Ambulatory Visit: Payer: Self-pay | Admitting: Urology

## 2018-01-13 MED ORDER — TESTOSTERONE 20.25 MG/1.25GM (1.62%) TD GEL: 2.0000 | Freq: Every day | TRANSDERMAL | 5 refills | Status: DC

## 2018-01-13 NOTE — Progress Notes (Signed)
Patient would like the generic AndroGel 1.62%, 2 pumps daily sent to [facility] as the Clomid is on back order.

## 2018-01-21 ENCOUNTER — Encounter: Payer: Self-pay | Admitting: Urology

## 2018-01-21 ENCOUNTER — Telehealth: Payer: Self-pay

## 2018-01-21 NOTE — Telephone Encounter (Signed)
Received prior authorization for testosterone gel, submitted via covermymeds.

## 2018-01-24 NOTE — Telephone Encounter (Signed)
Unfortunately if [name] wants his insurance to cover his medication, we will have to fulfill their criteria.   He may want to call around to different pharmacies to see if they have Clomid available.  Otherwise, we will need to check his testosterone levels before 9 am at least two days apart.  Clomid takes about 5 days to be cleared from the body.  I would suggest not checking his testosterone levels until he has been off the Clomid for at least two week.

## 2018-01-24 NOTE — Telephone Encounter (Signed)
Pt insurance will not cover testosterone Gel, insurance requires two recent early morning lab draws showing below normal results. Please advise.

## 2018-01-27 NOTE — Telephone Encounter (Signed)
Pt states [facility] got Clomid in stock. He has received a 2 month supply from them, he states not to worry about testosterone at this time. He will call if he needs anything.

## 2018-03-10 ENCOUNTER — Other Ambulatory Visit: Payer: Self-pay | Admitting: Urology

## 2018-03-10 DIAGNOSIS — E291 Testicular hypofunction: Secondary | ICD-10-CM

## 2018-03-25 DIAGNOSIS — M5136 Other intervertebral disc degeneration, lumbar region: Secondary | ICD-10-CM | POA: Insufficient documentation

## 2018-03-25 DIAGNOSIS — M4316 Spondylolisthesis, lumbar region: Secondary | ICD-10-CM | POA: Insufficient documentation

## 2018-04-29 ENCOUNTER — Telehealth: Payer: Self-pay | Admitting: Urology

## 2018-04-29 MED ORDER — TADALAFIL 2.5 MG PO TABS: ORAL_TABLET | ORAL | 0 refills | Status: DC

## 2018-04-29 NOTE — Telephone Encounter (Signed)
Anderson Malta from pharmacy called about Rx (Tadalafil)  Please give her a call at [phone].

## 2018-05-06 ENCOUNTER — Other Ambulatory Visit: Payer: BLUE CROSS/BLUE SHIELD

## 2018-05-06 DIAGNOSIS — E291 Testicular hypofunction: Secondary | ICD-10-CM

## 2018-05-07 LAB — PSA: Prostate Specific Ag, Serum: 2 ng/mL (ref 0.0–4.0)

## 2018-05-07 LAB — HEPATIC FUNCTION PANEL
ALK PHOS: 35 IU/L — AB (ref 39–117)
ALT: 14 IU/L (ref 0–44)
AST: 18 IU/L (ref 0–40)
Albumin: 4 g/dL (ref 3.6–4.8)
BILIRUBIN TOTAL: 0.4 mg/dL (ref 0.0–1.2)
Bilirubin, Direct: 0.09 mg/dL (ref 0.00–0.40)
Total Protein: 6.9 g/dL (ref 6.0–8.5)

## 2018-05-07 LAB — TESTOSTERONE: Testosterone: 592 ng/dL (ref 264–916)

## 2018-05-07 LAB — HEMATOCRIT: HEMATOCRIT: 44.2 % (ref 37.5–51.0)

## 2018-05-07 LAB — HEMOGLOBIN: Hemoglobin: 14.2 g/dL (ref 13.0–17.7)

## 2018-05-07 NOTE — Progress Notes (Signed)
8:59 AM   Raymond Flowers July 19, 1956 [id number]  Referring provider: Rusty Aus, MD [facility] [place], [place] [address]  Chief Complaint  Patient presents with  . Follow-up    HPI: [name] is a 62 year old Caucasian male with erectile dysfunction, testosterone deficiency and BPH with LUTS who presents today for 6 month follow-up.  Testosterone deficiency He is still having spontaneous erections at night.  He does not have sleep apnea.   He is reporting obesity.  His current testosterone level is 592 ng/dL on 05/06/2018.   HCT 44.2%, Hgb 14.2 and LFT's are normal on 05/06/2018.  He is currently managing his testosterone deficiency with Clomid 50 mg, 1 tablet daily.   Erectile dysfunction Patient's SHIM score is 20,  which is mild erectile dysfunction.  His previous SHIM score was 18.  He is currently managing his erectile dysfunction with Viagra 100 mg.  He denies any penile curvature or pain with erections, but has been experiencing a loss of erections during intercourse.     SHIM    Row Name 05/08/18 0844         SHIM: Over the last 6 months:   How do you rate your confidence that you could get and keep an erection?  Moderate     When you had erections with sexual stimulation, how often were your erections hard enough for penetration (entering your partner)?  Almost Always or Always     During sexual intercourse, how often were you able to maintain your erection after you had penetrated (entered) your partner?  Most Times (much more than half the time)     During sexual intercourse, how difficult was it to maintain your erection to completion of intercourse?  Slightly Difficult     When you attempted sexual intercourse, how often was it satisfactory for you?  Most Times (much more than half the time)       SHIM Total Score   SHIM  20        Score: 1-7 Severe ED 8-11 Moderate ED 12-16 Mild-Moderate ED 17-21 Mild  ED 22-25 No ED  BPH with LUTS His IPSS score today is 5, which is mild lower urinary tract symptomatology. He is pleased with his life due to his urinary symptoms.  His previous IPSS score was 6/1.  He is taking tadalafil 2.5 mg troches daily.  His main complaint today is nocturia x 3 and frequency, but he is drinking a lot of water.  He is not experiencing dysuria, gross hematuria or suprapubic pain. He also denies any recent fevers, chills, nausea, vomiting or urinary tract infections. IPSS    Row Name 05/08/18 0800         International Prostate Symptom Score   How often have you had the sensation of not emptying your bladder?  Not at All     How often have you had to urinate less than every two hours?  Less than half the time     How often have you found you stopped and started again several times when you urinated?  Not at All     How often have you found it difficult to postpone urination?  Not at All     How often have you had a weak urinary stream?  Not at All     How often have you had to strain to start urination?  Not at All     How many  times did you typically get up at night to urinate?  3 Times     Total IPSS Score  5       Quality of Life due to urinary symptoms   If you were to spend the rest of your life with your urinary condition just the way it is now how would you feel about that?  Pleased        Score:  1-7 Mild 8-19 Moderate 20-35 Severe  PMH: Past Medical History:  Diagnosis Date  . Androgen deficiency   . Benign enlargement of prostate   . Blood in semen   . BPH (benign prostatic hyperplasia)   . Erectile dysfunction   . Hypertension   . Impotence   . Over weight     Surgical History: Past Surgical History:  Procedure Laterality Date  . KNEE ARTHROSCOPY Right 2016  . SHOULDER SURGERY  2009   right  shoulder x 2  ([address]16)  . TONSILLECTOMY    . tricep surgery Left 2015  . VARICOCELE EXCISION      Home Medications:  Allergies as of 05/08/2018       Reactions   Codeine Other (See Comments)   Reaction:  Hallucinations      Medication List        Accurate as of 05/08/18  8:59 AM. Always use your most recent med list.          aspirin 81 MG tablet Take 81 mg by mouth daily.   clomiPHENE 50 MG tablet Commonly known as:  CLOMID TAKE ONE TABLET BY MOUTH EVERY DAY   Fish Oil 1000 MG Caps Take by mouth. Reported on 10/17/2015   hydrochlorothiazide 25 MG tablet Commonly known as:  HYDRODIURIL TK 1 T PO QD   naproxen 500 MG tablet Commonly known as:  NAPROSYN Reported on 10/17/2015   [facility] injection Generic drug:  Zoster Vaccine Adjuvanted Shingrix (PF) 50 mcg/0.5 mL intramuscular suspension, kit  ADM 0.5 ML I UTD   sildenafil 20 MG tablet Commonly known as:  REVATIO TAKE 3 TO 5 TABLETS BY MOUTH PER DAY AS NEEDED   Tadalafil 2.5 MG Tabs Dissolve (1) Troche by mouth as needed (1) hour prior to sexual activity   Testosterone 20.25 MG/1.25GM (1.62%) Gel Place 2 Pump onto the skin daily.   TRANSDERM-SCOP (1.5 MG) 1 MG/3DAYS Generic drug:  scopolamine APPLY 1 PATCH TO SKIN Q 3 DAYS   TROCHE MOLD 30 CAVITY Misc Tadalafil 2.5MG troche DISSOLVE ONE troche BY MOUTH AS NEEDED one hour prior TO sexual activity   TUMERSAID Tabs Take by mouth.   NF FORMULAS TESTOSTERONE Caps Testosterone supplement OTC one daily   vitamin B-12 1000 MCG tablet Commonly known as:  CYANOCOBALAMIN Take by mouth.       Allergies:  Allergies  Allergen Reactions  . Codeine Other (See Comments)    Reaction:  Hallucinations     Family History: Family History  Problem Relation Age of Onset  . Heart disease Mother 53  . Hypertension Mother   . Heart disease Father 73       MI   . Kidney Stones Father        mother  . Hypertension Brother   . Prostate cancer Neg Hx   . Kidney disease Neg Hx   . Kidney cancer Neg Hx   . Bladder Cancer Neg Hx     Social History:  reports that he has never smoked. He has never used  smokeless tobacco. He  reports that he drinks about 2.0 standard drinks of alcohol per week. He reports that he does not use drugs.  ROS: UROLOGY Frequent Urination?: No Hard to postpone urination?: No Burning/pain with urination?: No Get up at night to urinate?: No Leakage of urine?: No Urine stream starts and stops?: No Trouble starting stream?: No Do you have to strain to urinate?: No Blood in urine?: No Urinary tract infection?: No Sexually transmitted disease?: No Injury to kidneys or bladder?: No Painful intercourse?: No Weak stream?: No Erection problems?: Yes Penile pain?: No  Gastrointestinal Nausea?: No Vomiting?: No Indigestion/heartburn?: No Diarrhea?: No Constipation?: No  Constitutional Fever: No Night sweats?: No Weight loss?: No Fatigue?: No  Skin Skin rash/lesions?: No Itching?: No  Eyes Blurred vision?: No Double vision?: No  Ears/Nose/Throat Sore throat?: No Sinus problems?: No  Hematologic/Lymphatic Swollen glands?: No Easy bruising?: No  Cardiovascular Leg swelling?: No Chest pain?: No  Respiratory Cough?: No Shortness of breath?: No  Endocrine Excessive thirst?: No  Musculoskeletal Back pain?: No Joint pain?: No  Neurological Headaches?: No Dizziness?: No  Psychologic Depression?: No Anxiety?: No  Physical Exam: BP 127/78 (BP Location: Left Arm, Patient Position: Sitting, Cuff Size: Large)   Pulse 75   Ht '5\' 10"'$  (1.778 m)   Wt 206 lb 12.8 oz (93.8 kg)   BMI 29.67 kg/m   Constitutional: Well nourished. Alert and oriented, No acute distress. HEENT: [place] AT, moist mucus membranes. Trachea midline, no masses. Cardiovascular: No clubbing, cyanosis, or edema. Respiratory: Normal respiratory effort, no increased work of breathing. GI: Abdomen is soft, non tender, non distended, no abdominal masses. Liver and spleen not palpable.  No hernias appreciated.  Stool sample for occult testing is not indicated.   GU: No CVA  tenderness.  No bladder fullness or masses.  Patient with circumcised phallus.  Urethral meatus is patent.  No penile discharge. No penile lesions or rashes. Scrotum without lesions, cysts, rashes and/or edema.  Testicles are located scrotally bilaterally. No masses are appreciated in the testicles. Left and right epididymis are normal. Rectal: Patient with  normal sphincter tone. Anus and perineum without scarring or rashes. No rectal masses are appreciated. Prostate is approximately 45 grams, no nodules are appreciated. Seminal vesicles are normal. Skin: No rashes, bruises or suspicious lesions. Lymph: No cervical or inguinal adenopathy. Neurologic: Grossly intact, no focal deficits, moving all 4 extremities. Psychiatric: Normal mood and affect.    Laboratory Data: Results for orders placed or performed in visit on 05/06/18  Hepatic function panel  Result Value Ref Range   Total Protein 6.9 6.0 - 8.5 g/dL   Albumin 4.0 3.6 - 4.8 g/dL   Bilirubin Total 0.4 0.0 - 1.2 mg/dL   Bilirubin, Direct 0.09 0.00 - 0.40 mg/dL   Alkaline Phosphatase 35 (L) 39 - 117 IU/L   AST 18 0 - 40 IU/L   ALT 14 0 - 44 IU/L  Testosterone  Result Value Ref Range   Testosterone 592 264 - 916 ng/dL  PSA  Result Value Ref Range   Prostate Specific Ag, Serum 2.0 0.0 - 4.0 ng/mL  Hematocrit  Result Value Ref Range   Hematocrit 44.2 37.5 - 51.0 %  Hemoglobin  Result Value Ref Range   Hemoglobin 14.2 13.0 - 17.7 g/dL   Lab Results  Component Value Date   HGB 14.2 05/06/2018   HCT 44.2 05/06/2018   Lab Results  Component Value Date   CREATININE 1.17 04/21/2015   Lab Results  Component Value Date  TESTOSTERONE 592 05/06/2018  PSA history:     1.1 ng/mL on 09/03/2013     1.2 ng/mL on 03/04/2014     1.2 ng/mL on 10/04/2014     Component     Latest Ref Rng & Units 04/12/2015 10/13/2015 03/26/2016 07/24/2016  Prostate Specific Ag, Serum     0.0 - 4.0 ng/mL 1.6 1.4 1.4 1.2   Component     Latest Ref Rng  & Units 10/24/2016 04/22/2017 10/02/2017  Prostate Specific Ag, Serum     0.0 - 4.0 ng/mL 1.2 1.5 1.3   I have reviewed the labs  Assessment & Plan:    1. Increase in PSA velocity PSA has increased from 1.3 to 2.0 in less than one year - with our labs - labs with PCP PSA was 1.28  Explained to the patient that this is concerning and we will need to monitor his PSA more closely and have him return in one month for a repeat PSA, if it continues to rise we will need to stop testosterone therapy and pursue a prostate biopsy  2. Testosterone deficiency   Testosterone at therapeutic levels Continue Clomid 50 mg 1 tablet daily; prescription sent to pharmacy  RTC in 6 months for testosterone before 10 am, LFT's, HCT, PSA and Hgb  3. BPH with LUTS IPSS score is 5/1, it is slightly worse improved Continue conservative management, avoiding bladder irritants and timed voiding's Continue tadalafil 2.5 mg troches RTC in 6 months for IPSS, PSA and exam, as testosterone therapy can cause prostate enlargement and worsen LUTS  4. Erectile dysfunction:    SHIM score is 20, it is slightly worse Continue sildenafil; refills sent to pharmacy RTC in 6 months for SHIM score and exam, as testosterone therapy can affect erections    Return for PSA only .  Zara Council, PA-C  [facility] [address] [place], [place] [address] [phone]

## 2018-05-08 ENCOUNTER — Ambulatory Visit (INDEPENDENT_AMBULATORY_CARE_PROVIDER_SITE_OTHER): Payer: BLUE CROSS/BLUE SHIELD | Admitting: Urology

## 2018-05-08 ENCOUNTER — Encounter: Payer: Self-pay | Admitting: Urology

## 2018-05-08 VITALS — BP 127/78 | HR 75 | Ht 70.0 in | Wt 206.8 lb

## 2018-05-08 DIAGNOSIS — N401 Enlarged prostate with lower urinary tract symptoms: Secondary | ICD-10-CM | POA: Diagnosis not present

## 2018-05-08 DIAGNOSIS — N138 Other obstructive and reflux uropathy: Secondary | ICD-10-CM

## 2018-05-08 DIAGNOSIS — N529 Male erectile dysfunction, unspecified: Secondary | ICD-10-CM

## 2018-05-08 DIAGNOSIS — R972 Elevated prostate specific antigen [PSA]: Secondary | ICD-10-CM

## 2018-05-08 DIAGNOSIS — E349 Endocrine disorder, unspecified: Secondary | ICD-10-CM

## 2018-05-08 DIAGNOSIS — E291 Testicular hypofunction: Secondary | ICD-10-CM

## 2018-05-08 MED ORDER — SILDENAFIL CITRATE 20 MG PO TABS: ORAL_TABLET | ORAL | 3 refills | Status: DC

## 2018-05-08 MED ORDER — CLOMIPHENE CITRATE 50 MG PO TABS: ORAL_TABLET | ORAL | 3 refills | Status: DC

## 2018-06-13 ENCOUNTER — Other Ambulatory Visit: Payer: BLUE CROSS/BLUE SHIELD

## 2018-06-13 DIAGNOSIS — R972 Elevated prostate specific antigen [PSA]: Secondary | ICD-10-CM

## 2018-06-14 LAB — PSA: PROSTATE SPECIFIC AG, SERUM: 1.3 ng/mL (ref 0.0–4.0)

## 2018-06-20 ENCOUNTER — Telehealth: Payer: Self-pay | Admitting: Urology

## 2018-06-20 NOTE — Telephone Encounter (Signed)
-----   Message from Nori Riis, PA-C sent at 06/15/2018  5:31 PM EDT ----- Please call [name] and schedule a PSA (lab appointment) only in three months.

## 2018-06-20 NOTE — Telephone Encounter (Signed)
App has been scheduled for Dec 20th in [facility]

## 2018-08-15 ENCOUNTER — Other Ambulatory Visit: Payer: Self-pay

## 2018-08-15 NOTE — Telephone Encounter (Signed)
Pt sent mychart message requesting refill of tadalafil trouche. Per Zara Council ok to refill, refill sent by phone on provider line.

## 2018-08-15 NOTE — Telephone Encounter (Signed)
RX Sent in.  

## 2018-09-05 ENCOUNTER — Other Ambulatory Visit: Payer: BLUE CROSS/BLUE SHIELD

## 2018-09-05 DIAGNOSIS — R972 Elevated prostate specific antigen [PSA]: Secondary | ICD-10-CM

## 2018-09-06 LAB — PSA: Prostate Specific Ag, Serum: 1.3 ng/mL (ref 0.0–4.0)

## 2018-09-08 ENCOUNTER — Other Ambulatory Visit: Payer: Self-pay

## 2018-09-08 DIAGNOSIS — E349 Endocrine disorder, unspecified: Secondary | ICD-10-CM

## 2018-09-08 DIAGNOSIS — E291 Testicular hypofunction: Secondary | ICD-10-CM

## 2018-09-08 DIAGNOSIS — R972 Elevated prostate specific antigen [PSA]: Secondary | ICD-10-CM

## 2018-09-08 DIAGNOSIS — N138 Other obstructive and reflux uropathy: Secondary | ICD-10-CM

## 2018-09-08 DIAGNOSIS — N529 Male erectile dysfunction, unspecified: Secondary | ICD-10-CM

## 2018-09-08 DIAGNOSIS — N401 Enlarged prostate with lower urinary tract symptoms: Secondary | ICD-10-CM

## 2018-09-12 ENCOUNTER — Other Ambulatory Visit: Payer: BLUE CROSS/BLUE SHIELD

## 2018-10-31 ENCOUNTER — Other Ambulatory Visit: Payer: Self-pay | Admitting: Urology

## 2018-10-31 DIAGNOSIS — E291 Testicular hypofunction: Secondary | ICD-10-CM

## 2018-10-31 MED ORDER — SILDENAFIL CITRATE 100 MG PO TABS: 100.0000 mg | ORAL_TABLET | Freq: Every day | ORAL | 3 refills | Status: DC | PRN

## 2018-10-31 MED ORDER — CLOMIPHENE CITRATE 50 MG PO TABS: ORAL_TABLET | ORAL | 3 refills | Status: DC

## 2018-10-31 NOTE — Progress Notes (Signed)
Scripts sent to [facility] for clomid and sildenafil.

## 2018-11-10 ENCOUNTER — Other Ambulatory Visit: Payer: BLUE CROSS/BLUE SHIELD

## 2018-11-10 DIAGNOSIS — E291 Testicular hypofunction: Secondary | ICD-10-CM

## 2018-11-10 DIAGNOSIS — N401 Enlarged prostate with lower urinary tract symptoms: Secondary | ICD-10-CM

## 2018-11-10 DIAGNOSIS — R972 Elevated prostate specific antigen [PSA]: Secondary | ICD-10-CM

## 2018-11-10 DIAGNOSIS — E349 Endocrine disorder, unspecified: Secondary | ICD-10-CM

## 2018-11-10 DIAGNOSIS — N138 Other obstructive and reflux uropathy: Secondary | ICD-10-CM

## 2018-11-10 DIAGNOSIS — N529 Male erectile dysfunction, unspecified: Secondary | ICD-10-CM

## 2018-11-11 LAB — HEPATIC FUNCTION PANEL
ALT: 15 IU/L (ref 0–44)
AST: 18 IU/L (ref 0–40)
Albumin: 4.1 g/dL (ref 3.8–4.8)
Alkaline Phosphatase: 39 IU/L (ref 39–117)
BILIRUBIN TOTAL: 0.2 mg/dL (ref 0.0–1.2)
BILIRUBIN, DIRECT: 0.08 mg/dL (ref 0.00–0.40)
Total Protein: 6.5 g/dL (ref 6.0–8.5)

## 2018-11-11 LAB — HEMOGLOBIN: HEMOGLOBIN: 13.5 g/dL (ref 13.0–17.7)

## 2018-11-11 LAB — HEMATOCRIT: Hematocrit: 40.2 % (ref 37.5–51.0)

## 2018-11-11 LAB — PSA: Prostate Specific Ag, Serum: 1.8 ng/mL (ref 0.0–4.0)

## 2018-11-11 LAB — TESTOSTERONE: Testosterone: 532 ng/dL (ref 264–916)

## 2018-11-13 NOTE — Progress Notes (Signed)
Error

## 2018-11-14 ENCOUNTER — Ambulatory Visit: Payer: BLUE CROSS/BLUE SHIELD | Admitting: Urology

## 2018-11-14 ENCOUNTER — Encounter: Payer: Self-pay | Admitting: Urology

## 2018-11-14 VITALS — BP 147/84 | HR 58 | Ht 70.0 in | Wt 210.0 lb

## 2018-11-14 DIAGNOSIS — N529 Male erectile dysfunction, unspecified: Secondary | ICD-10-CM | POA: Diagnosis not present

## 2018-11-14 DIAGNOSIS — N138 Other obstructive and reflux uropathy: Secondary | ICD-10-CM | POA: Diagnosis not present

## 2018-11-14 DIAGNOSIS — N401 Enlarged prostate with lower urinary tract symptoms: Secondary | ICD-10-CM | POA: Diagnosis not present

## 2018-11-14 DIAGNOSIS — E349 Endocrine disorder, unspecified: Secondary | ICD-10-CM

## 2018-11-14 NOTE — Progress Notes (Signed)
11/14/2018 10:51 AM  Raymond Flowers April 21, 1956 [id number]  Referring Provider: Rusty Aus, MD [facility] [place], [place] [address]  Chief Complaint  Patient presents with  . Increased PSA  . Benign Prostatic Hypertrophy   HPI:  [name] is a 63 year old Caucasian male with erectile dysfunction, testosterone deficiency and BPH with LUTS who presents today for 6 month follow-up.   Testosterone deficiency  He is still having spontaneous erections at night. He does not have sleep apnea. He is reporting obesity. His current testosterone level is 532 ng/dL on 11/10/2018. HCT 40.2%, Hgb 13.5 and LFT's are normal on 11/10/2018. He is currently managing his testosterone deficiency with Clomid 50 mg, 1 tablet daily. His HGB (16.3 to 13.5) and HCT (47 to 42%) levels have dropped precipitously over the last year. The patient admits a history of iron deficiency anemia and that he has been donating blood more frequently recently. He denies gross hematuria, hematemesis and any abnormal bleeding.   Erectile dysfunction  Patient's SHIM score is 21, which is mild erectile dysfunction. His previous SHIM score was 20. He is currently managing his erectile dysfunction with Viagra 100 mg.  He reports that he is pleased with this change in medication as he has noticed improved changes in the quality of his erections. He continues to have spontaneous erections. He denies He denies any penile curvature or pain with erections, but has been experiencing a loss of erections during intercourse.   SHIM    Row Name 11/14/18 1024         SHIM: Over the last 6 months:   How do you rate your confidence that you could get and keep an erection?  Moderate     When you had erections with sexual stimulation, how often were your erections hard enough for penetration (entering your partner)?  Almost Always or Always     During sexual intercourse, how often were you  able to maintain your erection after you had penetrated (entered) your partner?  Most Times (much more than half the time)     During sexual intercourse, how difficult was it to maintain your erection to completion of intercourse?  Slightly Difficult     When you attempted sexual intercourse, how often was it satisfactory for you?  Almost Always or Always       SHIM Total Score   SHIM  21       Score: 1-7 Severe ED 8-11 Moderate ED 12-16 Mild-Moderate ED 17-21 Mild ED 22-25 No ED  BPH with LUTS  His IPSS score today is 6, which is mild lower urinary tract symptomatology. He is pleased with his life due to his urinary symptoms. His previous IPSS score was 5/1. Patient was previously on Cialis but noted no difference and has since been inconsistent in his usage. He plans to discontinue Cialis. His main complaint today is nocturia x 3 and frequency, but he is drinking a lot of water. He is not experiencing dysuria, gross hematuria or suprapubic pain. He also denies any recent fevers, chills, nausea, vomiting or urinary tract infections.   IPSS    Row Name 11/14/18 1000         International Prostate Symptom Score   How often have you had the sensation of not emptying your bladder?  Not at All     How often have you had to urinate less than every two hours?  About half the time  How often have you found you stopped and started again several times when you urinated?  Not at All     How often have you found it difficult to postpone urination?  Not at All     How often have you had a weak urinary stream?  Not at All     How often have you had to strain to start urination?  Not at All     How many times did you typically get up at night to urinate?  3 Times     Total IPSS Score  6       Quality of Life due to urinary symptoms   If you were to spend the rest of your life with your urinary condition just the way it is now how would you feel about that?  Pleased       Score:  1-7  Mild 8-19 Moderate 20-35 Severe  Past Medical History:  Diagnosis Date  . Androgen deficiency   . Benign enlargement of prostate   . Blood in semen   . BPH (benign prostatic hyperplasia)   . Erectile dysfunction   . Hypertension   . Impotence   . Over weight    Past Surgical History:  Procedure Laterality Date  . KNEE ARTHROSCOPY Right 2016  . SHOULDER SURGERY  2009   right  shoulder x 2  ([address])  . TONSILLECTOMY    . tricep surgery Left 2015  . VARICOCELE EXCISION     Allergies as of 11/14/2018      Reactions   Codeine Other (See Comments)   Reaction:  Hallucinations      Medication List       Accurate as of November 14, 2018 10:51 AM. Always use your most recent med list.        aspirin 81 MG tablet Take 81 mg by mouth daily.   clomiPHENE 50 MG tablet Commonly known as:  CLOMID TAKE ONE TABLET BY MOUTH EVERY DAY   Fish Oil 1000 MG Caps Take by mouth. Reported on 10/17/2015   hydrochlorothiazide 25 MG tablet Commonly known as:  HYDRODIURIL TK 1 T PO QD   naproxen 500 MG tablet Commonly known as:  NAPROSYN Reported on 10/17/2015   [facility] injection Generic drug:  Zoster Vaccine Adjuvanted Shingrix (PF) 50 mcg/0.5 mL intramuscular suspension, kit  ADM 0.5 ML I UTD   sildenafil 100 MG tablet Commonly known as:  VIAGRA Take 1 tablet (100 mg total) by mouth daily as needed for erectile dysfunction. Take two hours prior to intercourse on an empty stomach   sildenafil 20 MG tablet Commonly known as:  REVATIO TAKE 3 TO 5 TABLETS BY MOUTH PER DAY AS NEEDED   Tadalafil 2.5 MG Tabs Dissolve (1) Troche by mouth as needed (1) hour prior to sexual activity   Testosterone 20.25 MG/1.25GM (1.62%) Gel Place 2 Pump onto the skin daily.   TRANSDERM-SCOP (1.5 MG) 1 MG/3DAYS Generic drug:  scopolamine APPLY 1 PATCH TO SKIN Q 3 DAYS   TROCHE MOLD 30 CAVITY Misc Tadalafil 2.5MG troche DISSOLVE ONE troche BY MOUTH AS NEEDED one hour prior TO sexual activity    TUMERSAID Tabs Take by mouth.   NF FORMULAS TESTOSTERONE Caps Testosterone supplement OTC one daily   vitamin B-12 1000 MCG tablet Commonly known as:  CYANOCOBALAMIN Take by mouth.       Allergies  Allergen Reactions  . Codeine Other (See Comments)    Reaction:  Hallucinations    Family  History  Problem Relation Age of Onset  . Heart disease Mother 78  . Hypertension Mother   . Heart disease Father 17       MI   . Kidney Stones Father        mother  . Hypertension Brother   . Prostate cancer Neg Hx   . Kidney disease Neg Hx   . Kidney cancer Neg Hx   . Bladder Cancer Neg Hx    Social History: reports that he has never smoked. He has never used smokeless tobacco. He reports current alcohol use of about 2.0 standard drinks of alcohol per week. He reports that he does not use drugs.   UROLOGY Frequent Urination?: No Hard to postpone urination?: No Burning/pain with urination?: No Get up at night to urinate?: No Leakage of urine?: No Urine stream starts and stops?: No Trouble starting stream?: No Do you have to strain to urinate?: No Blood in urine?: No Urinary tract infection?: No Sexually transmitted disease?: No Injury to kidneys or bladder?: No Painful intercourse?: No Weak stream?: No Erection problems?: No Penile pain?: No Gastrointestinal Nausea?: No Vomiting?: No Indigestion/heartburn?: No Diarrhea?: No Constipation?: No Constitutional Fever: No Night sweats?: No Weight loss?: No Fatigue?: No Skin Skin rash/lesions?: No Itching?: No Eyes Blurred vision?: No Double vision?: No Ears/Nose/Throat Sore throat?: No Sinus problems?: No Hematologic/Lymphatic Swollen glands?: No Easy bruising?: No Cardiovascular Leg swelling?: No Chest pain?: No Respiratory Cough?: No Shortness of breath?: No Endocrine Excessive thirst?: No Musculoskeletal Back pain?: No Joint pain?: No Neurological Headaches?: No Dizziness?:  No Psychologic Depression?: No Anxiety?: No   Physical Exam: BP (!) 147/84 (BP Location: Left Arm, Patient Position: Sitting)   Pulse (!) 58   Ht 5' 10"  (1.778 m)   Wt 210 lb (95.3 kg)   BMI 30.13 kg/m   Constitutional:  Alert and oriented, No acute distress. Respiratory: Normal respiratory effort, no increased work of breathing. Head: Normocephalic and Atraumatic. GU: No CVA tenderness.  No bladder fullness or masses.  Patient with circumcised. Urethral meatus is patent.  No penile discharge. No penile lesions or rashes. Scrotum without lesions, cysts, rashes and/or edema.  Testicles are located scrotally bilaterally. No masses are appreciated in the testicles. Left and right epididymis are normal. Rectal: Patient with  normal sphincter tone. Anus and perineum without scarring or rashes. No rectal masses are appreciated. Prostate is approximately 50 grams, no nodules are appreciated. Could only palpate apex and midportion Skin: No rashes, bruises or suspicious lesions. Neurologic: Grossly intact, no focal deficits, moving all 4 extremities. Psychiatric: Normal mood and affect.   Laboratory Results Lab Results  Component Value Date   HGB 13.5 11/10/2018   HCT 40.2 11/10/2018   PSA history:  1.1 ng/mL on 09/03/2013  1.2 ng/mL on 03/04/2014  1.2 ng/mL on 10/04/2014  Component Latest Ref Rng & Units 04/12/2015 10/13/2015 03/26/2016 07/24/2016  Prostate Specific Ag, Serum 0.0 - 4.0 ng/mL 1.6 1.4 1.4 1.2   Component Latest Ref Rng & Units 10/24/2016 04/22/2017 10/02/2017 05/06/2018  Prostate Specific Ag, Serum 0.0 - 4.0 ng/mL 1.2 1.5 1.3 2.0   Component Latest Ref Rng & Units 06/13/2018 09/05/2018 11/10/2018  Prostate Specific Ag, Serum 0.0 - 4.0 ng/mL 1.3 1.3 1.8   I have reviewed the labs   Assessment & Plan:  1. Testosterone deficiency   - Testosterone at therapeutic levels   - Discussed his decreasing HGB levels, patient admits history of iron deficiency. I recommended he  discuss this further  with his PCP at his next appointment  - Continue Clomid 50 mg 1 tablet daily; prescription sent to pharmacy   - RTC in 6 months for testosterone before 10 am, LFT's, HCT, PSA and Hgb   2. BPH with LUTS   - IPSS score is 6/1, it has worsened slightly (prev 5) but he remains pleased with his quality of life  - Continue conservative management, avoiding bladder irritants and timed voiding's   - Discontinue tadalafil 2.5 mg troches   - RTC in 6 months for IPSS, PSA and exam, as testosterone therapy can cause prostate enlargement and worsen LUTS   3. Erectile dysfunction  - SHIM score is 21, it is slightly worse  - Patient admits that he is currently pleased with managing his ED on Viagra 100 mg. Plan to continue.  - RTC in 6 months for SHIM score and exam, as testosterone therapy can affect erections   Return in about 6 months (around 05/15/2019) for PSA, LFT's, HCT, HBG, testosterone (before 10 AM), IPSS, SHIM and exam.   Zara Council, PA-C  [facility]  [place]  [place], [place] [address]  [phone]  I, Temidayo Atanda-Ogunleye , am acting as a scribe for [facility], PA-C  I have reviewed the above documentation for accuracy and completeness, and I agree with the above.    Zara Council, PA-C

## 2019-02-03 ENCOUNTER — Other Ambulatory Visit: Payer: Self-pay | Admitting: Urology

## 2019-05-12 ENCOUNTER — Other Ambulatory Visit: Payer: Self-pay | Admitting: Family Medicine

## 2019-05-12 DIAGNOSIS — E349 Endocrine disorder, unspecified: Secondary | ICD-10-CM

## 2019-05-12 DIAGNOSIS — R972 Elevated prostate specific antigen [PSA]: Secondary | ICD-10-CM

## 2019-05-13 ENCOUNTER — Other Ambulatory Visit: Payer: BC Managed Care – PPO

## 2019-05-13 ENCOUNTER — Other Ambulatory Visit: Payer: Self-pay

## 2019-05-13 DIAGNOSIS — R972 Elevated prostate specific antigen [PSA]: Secondary | ICD-10-CM

## 2019-05-13 DIAGNOSIS — E349 Endocrine disorder, unspecified: Secondary | ICD-10-CM

## 2019-05-14 LAB — HEMATOCRIT: Hematocrit: 46.9 % (ref 37.5–51.0)

## 2019-05-14 LAB — HEPATIC FUNCTION PANEL
ALT: 16 IU/L (ref 0–44)
AST: 20 IU/L (ref 0–40)
Albumin: 4.6 g/dL (ref 3.8–4.8)
Alkaline Phosphatase: 41 IU/L (ref 39–117)
Bilirubin Total: 0.5 mg/dL (ref 0.0–1.2)
Bilirubin, Direct: 0.14 mg/dL (ref 0.00–0.40)
Total Protein: 6.6 g/dL (ref 6.0–8.5)

## 2019-05-14 LAB — HEMOGLOBIN: Hemoglobin: 15.7 g/dL (ref 13.0–17.7)

## 2019-05-14 LAB — TESTOSTERONE: Testosterone: 604 ng/dL (ref 264–916)

## 2019-05-14 LAB — PSA: Prostate Specific Ag, Serum: 1.3 ng/mL (ref 0.0–4.0)

## 2019-05-14 NOTE — Progress Notes (Signed)
05/15/2019 11:25 AM  Raymond Flowers June 02, 1956 [id number]  Referring Provider: Rusty Aus, MD [facility] [place],  [place] [address]  Chief Complaint  Patient presents with  . Hypogonadism   HPI:  [name] is a 63 year old male with erectile dysfunction, testosterone deficiency and BPH with LUTS who presents today for 6 month follow-up.   Testosterone deficiency  He is still having spontaneous erections at night. He does not have sleep apnea. He is reporting obesity. His current testosterone level is 604 ng/dL on 05/13/2019. HCT 46.9 %, Hgb 15.7 and LFT's are normal on 008/19/2020. He is currently managing his testosterone deficiency with Clomid 50 mg, 1 tablet daily.   Erectile dysfunction  Patient's SHIM score is 19, which is mild erectile dysfunction. His previous SHIM score was 21. He is currently managing his erectile dysfunction with Viagra 100 mg and supplementing with the 20 mg sildenafil.  He continues to have spontaneous erections.   He denies any penile curvature or pain with erections.  [place] Name 05/15/19 1100         SHIM: Over the last 6 months:   How do you rate your confidence that you could get and keep an erection?  Moderate     When you had erections with sexual stimulation, how often were your erections hard enough for penetration (entering your partner)?  Sometimes (about half the time)     During sexual intercourse, how often were you able to maintain your erection after you had penetrated (entered) your partner?  Almost Always or Always     During sexual intercourse, how difficult was it to maintain your erection to completion of intercourse?  Slightly Difficult     When you attempted sexual intercourse, how often was it satisfactory for you?  Most Times (much more than half the time)       SHIM Total Score   SHIM  19       Score: 1-7 Severe ED 8-11 Moderate ED 12-16 Mild-Moderate ED  17-21 Mild ED 22-25 No ED  BPH with LUTS  His IPSS score today is 3, which is mild lower urinary tract symptomatology. He is pleased with his life due to his urinary symptoms. His previous IPSS score was 6/1.  He is not experiencing dysuria, gross hematuria or suprapubic pain. He also denies any recent fevers, chills, nausea, vomiting or urinary tract infections.  IPSS    Row Name 05/15/19 1100         International Prostate Symptom Score   How often have you had the sensation of not emptying your bladder?  Not at All     How often have you had to urinate less than every two hours?  Less than 1 in 5 times     How often have you found you stopped and started again several times when you urinated?  Not at All     How often have you found it difficult to postpone urination?  Not at All     How often have you had a weak urinary stream?  Not at All     How often have you had to strain to start urination?  Not at All     How many times did you typically get up at night to urinate?  2 Times     Total IPSS Score  3       Quality of Life due to urinary symptoms  If you were to spend the rest of your life with your urinary condition just the way it is now how would you feel about that?  Pleased       Score:  1-7 Mild 8-19 Moderate 20-35 Severe  Past Medical History:  Diagnosis Date  . Androgen deficiency   . Benign enlargement of prostate   . Blood in semen   . BPH (benign prostatic hyperplasia)   . Erectile dysfunction   . Hypertension   . Impotence   . Over weight    Past Surgical History:  Procedure Laterality Date  . KNEE ARTHROSCOPY Right 2016  . SHOULDER SURGERY  2009   right  shoulder x 2  (20016)  . TONSILLECTOMY    . tricep surgery Left 2015  . VARICOCELE EXCISION     Allergies as of 05/15/2019      Reactions   Codeine Other (See Comments)   Reaction:  Hallucinations      Medication List       Accurate as of May 15, 2019 11:25 AM. If you have any  questions, ask your nurse or doctor.        STOP taking these medications   NF Formulas Testosterone Caps Stopped by: Takara Sermons, PA-C   Tadalafil 2.5 MG Tabs Stopped by: Amere Bricco, PA-C   Testosterone 20.25 MG/1.25GM (1.62%) Gel Stopped by: Fitzgerald Dunne, PA-C   Troche Mold 30 Cavity Misc Stopped by: Kyiesha Millward, PA-C   Tumersaid Tabs Stopped by: Zara Council, PA-C     TAKE these medications   aspirin 81 MG tablet Take 81 mg by mouth daily.   clomiPHENE 50 MG tablet Commonly known as: CLOMID TAKE ONE TABLET BY MOUTH EVERY DAY   Fish Oil 1000 MG Caps Take by mouth. Reported on 10/17/2015   hydrochlorothiazide 25 MG tablet Commonly known as: HYDRODIURIL TK 1 T PO QD   naproxen 500 MG tablet Commonly known as: NAPROSYN Reported on 10/17/2015   Shingrix injection Generic drug: Zoster Vaccine Adjuvanted Shingrix (PF) 50 mcg/0.5 mL intramuscular suspension, kit  ADM 0.5 ML I UTD   sildenafil 100 MG tablet Commonly known as: VIAGRA Take 1 tablet (100 mg total) by mouth daily as needed for erectile dysfunction. Take two hours prior to intercourse on an empty stomach   sildenafil 20 MG tablet Commonly known as: REVATIO TAKE 3 TO 5 TABLETS BY MOUTH PER DAY AS NEEDED   Transderm-Scop (1.5 MG) 1 MG/3DAYS Generic drug: scopolamine APPLY 1 PATCH TO SKIN Q 3 DAYS   vitamin B-12 1000 MCG tablet Commonly known as: CYANOCOBALAMIN Take by mouth.       Allergies  Allergen Reactions  . Codeine Other (See Comments)    Reaction:  Hallucinations    Family History  Problem Relation Age of Onset  . Heart disease Mother 51  . Hypertension Mother   . Heart disease Father 60       MI   . Kidney Stones Father        mother  . Hypertension Brother   . Prostate cancer Neg Hx   . Kidney disease Neg Hx   . Kidney cancer Neg Hx   . Bladder Cancer Neg Hx    Social History: reports that he has never smoked. He has never used smokeless tobacco. He  reports current alcohol use of about 2.0 standard drinks of alcohol per week. He reports that he does not use drugs.   UROLOGY Frequent Urination?: No Hard to postpone urination?:  No Burning/pain with urination?: No Get up at night to urinate?: No Leakage of urine?: No Urine stream starts and stops?: No Trouble starting stream?: No Do you have to strain to urinate?: No Blood in urine?: No Urinary tract infection?: No Sexually transmitted disease?: No Injury to kidneys or bladder?: No Painful intercourse?: No Weak stream?: No Erection problems?: Yes Penile pain?: No Gastrointestinal Nausea?: No Vomiting?: No Indigestion/heartburn?: No Diarrhea?: No Constipation?: No Constitutional Fever: No Night sweats?: No Weight loss?: No Fatigue?: No Skin Skin rash/lesions?: No Itching?: No Eyes Blurred vision?: No Double vision?: No Ears/Nose/Throat Sore throat?: No Sinus problems?: No Hematologic/Lymphatic Swollen glands?: No Easy bruising?: No Cardiovascular Leg swelling?: No Chest pain?: No Respiratory Cough?: No Shortness of breath?: No Endocrine Excessive thirst?: No Musculoskeletal Back pain?: No Joint pain?: No Neurological Headaches?: No Dizziness?: No Psychologic Depression?: No Anxiety?: No   Physical Exam: BP (!) 144/87 (BP Location: Left Arm, Patient Position: Sitting, Cuff Size: Normal)   Pulse 79   Ht 5' 10"  (1.778 m)   Wt 207 lb (93.9 kg)   BMI 29.70 kg/m   Constitutional:  Well nourished. Alert and oriented, No acute distress. HEENT: [place] AT, moist mucus membranes.  Trachea midline, no masses. Cardiovascular: No clubbing, cyanosis, or edema. Respiratory: Normal respiratory effort, no increased work of breathing. GI: Abdomen is soft, non tender, non distended, no abdominal masses. Liver and spleen not palpable.  No hernias appreciated.  Stool sample for occult testing is not indicated.   GU: No CVA tenderness.  No bladder fullness or masses.   Patient with circumcised phallus.  Urethral meatus is patent.  No penile discharge. No penile lesions or rashes. Scrotum without lesions, cysts, rashes and/or edema.  Testicles are located scrotally bilaterally. No masses are appreciated in the testicles. Left and right epididymis are normal. Rectal: Patient with  normal sphincter tone. Anus and perineum without scarring or rashes. No rectal masses are appreciated. Prostate is approximately 50 grams, could only palpate the apex and the midportion of the gland, no nodules are appreciated. Seminal vesicles could not be palpated.  Skin: No rashes, bruises or suspicious lesions. Lymph: No inguinal adenopathy. Neurologic: Grossly intact, no focal deficits, moving all 4 extremities. Psychiatric: Normal mood and affect.   Laboratory Results  Lab Results  Component Value Date   HGB 15.7 05/13/2019   HCT 46.9 05/13/2019   PSA history:  1.1 ng/mL on 09/03/2013  1.2 ng/mL on 03/04/2014  1.2 ng/mL on 10/04/2014  Component     Latest Ref Rng & Units 04/12/2015 10/13/2015 03/26/2016 07/24/2016  Prostate Specific Ag, Serum     0.0 - 4.0 ng/mL 1.6 1.4 1.4 1.2   Component     Latest Ref Rng & Units 10/24/2016 04/22/2017 10/02/2017 05/06/2018  Prostate Specific Ag, Serum     0.0 - 4.0 ng/mL 1.2 1.5 1.3 2.0   Component     Latest Ref Rng & Units 06/13/2018 09/05/2018 11/10/2018 05/13/2019  Prostate Specific Ag, Serum     0.0 - 4.0 ng/mL 1.3 1.3 1.8 1.3    Assessment & Plan:   1. Testosterone deficiency  - Testosterone at therapeutic levels  - Continue Clomid 50 mg 1 tablet daily; prescription sent to pharmacy  - RTC in 6 months for testosterone before 10 am, LFT's, HCT, PSA and Hgb   2. BPH with LUTS  - IPSS score is 3/1, it has improved - Continue conservative management, avoiding bladder irritants and timed voiding's  - RTC in 6 months  for IPSS, PSA and exam, as testosterone therapy can cause prostate enlargement and worsen LUTS   3. Erectile  dysfunction - SHIM score is 19, it is slightly worse - Patient admits that he is currently pleased with managing his ED on Viagra 100 mg with salvage 20 mg sildenafil - RTC in 6 months for SHIM score and exam, as testosterone therapy can affect erections   Return in about 6 months (around 11/15/2019) for PSA, testosterone (before 10 am), H & H, LFT's, SHIM, IPSS and exam .   Zara Council, [facility]  [place]  [address]  [place], [place] [address]  [phone]

## 2019-05-15 ENCOUNTER — Other Ambulatory Visit: Payer: Self-pay

## 2019-05-15 ENCOUNTER — Encounter: Payer: Self-pay | Admitting: Urology

## 2019-05-15 ENCOUNTER — Ambulatory Visit: Payer: BC Managed Care – PPO | Admitting: Urology

## 2019-05-15 VITALS — BP 144/87 | HR 79 | Ht 70.0 in | Wt 207.0 lb

## 2019-05-15 DIAGNOSIS — N138 Other obstructive and reflux uropathy: Secondary | ICD-10-CM

## 2019-05-15 DIAGNOSIS — E349 Endocrine disorder, unspecified: Secondary | ICD-10-CM | POA: Diagnosis not present

## 2019-05-15 DIAGNOSIS — N529 Male erectile dysfunction, unspecified: Secondary | ICD-10-CM | POA: Diagnosis not present

## 2019-05-15 DIAGNOSIS — N401 Enlarged prostate with lower urinary tract symptoms: Secondary | ICD-10-CM

## 2019-06-02 ENCOUNTER — Other Ambulatory Visit: Payer: Self-pay | Admitting: Urology

## 2019-11-01 ENCOUNTER — Other Ambulatory Visit: Payer: Self-pay | Admitting: Urology

## 2019-11-01 DIAGNOSIS — E291 Testicular hypofunction: Secondary | ICD-10-CM

## 2019-11-12 ENCOUNTER — Other Ambulatory Visit: Payer: BC Managed Care – PPO

## 2019-11-16 ENCOUNTER — Other Ambulatory Visit: Payer: Self-pay | Admitting: *Deleted

## 2019-11-16 DIAGNOSIS — R972 Elevated prostate specific antigen [PSA]: Secondary | ICD-10-CM

## 2019-11-16 DIAGNOSIS — E349 Endocrine disorder, unspecified: Secondary | ICD-10-CM

## 2019-11-16 NOTE — Addendum Note (Signed)
Addended by: Verlene Mayer A on: 11/16/2019 12:13 PM   Modules accepted: Orders

## 2019-11-17 ENCOUNTER — Other Ambulatory Visit: Payer: Self-pay

## 2019-11-17 ENCOUNTER — Other Ambulatory Visit: Payer: BC Managed Care – PPO

## 2019-11-17 ENCOUNTER — Ambulatory Visit: Payer: BC Managed Care – PPO | Admitting: Urology

## 2019-11-17 DIAGNOSIS — R972 Elevated prostate specific antigen [PSA]: Secondary | ICD-10-CM

## 2019-11-17 DIAGNOSIS — E349 Endocrine disorder, unspecified: Secondary | ICD-10-CM

## 2019-11-18 LAB — HEPATIC FUNCTION PANEL
ALT: 18 IU/L (ref 0–44)
AST: 21 IU/L (ref 0–40)
Albumin: 4.4 g/dL (ref 3.8–4.8)
Alkaline Phosphatase: 48 IU/L (ref 39–117)
Bilirubin Total: 0.3 mg/dL (ref 0.0–1.2)
Bilirubin, Direct: 0.1 mg/dL (ref 0.00–0.40)
Total Protein: 6.7 g/dL (ref 6.0–8.5)

## 2019-11-18 LAB — HEMOGLOBIN AND HEMATOCRIT, BLOOD
Hematocrit: 47.1 % (ref 37.5–51.0)
Hemoglobin: 16.9 g/dL (ref 13.0–17.7)

## 2019-11-18 LAB — TESTOSTERONE: Testosterone: 555 ng/dL (ref 264–916)

## 2019-11-18 LAB — PSA: Prostate Specific Ag, Serum: 1.3 ng/mL (ref 0.0–4.0)

## 2019-11-19 ENCOUNTER — Ambulatory Visit: Payer: BC Managed Care – PPO | Admitting: Urology

## 2019-11-19 ENCOUNTER — Encounter: Payer: Self-pay | Admitting: Urology

## 2019-11-19 ENCOUNTER — Other Ambulatory Visit: Payer: Self-pay

## 2019-11-19 VITALS — BP 150/91 | HR 83 | Ht 70.0 in | Wt 204.0 lb

## 2019-11-19 DIAGNOSIS — N138 Other obstructive and reflux uropathy: Secondary | ICD-10-CM | POA: Diagnosis not present

## 2019-11-19 DIAGNOSIS — N401 Enlarged prostate with lower urinary tract symptoms: Secondary | ICD-10-CM

## 2019-11-19 DIAGNOSIS — E349 Endocrine disorder, unspecified: Secondary | ICD-10-CM | POA: Diagnosis not present

## 2019-11-19 DIAGNOSIS — N529 Male erectile dysfunction, unspecified: Secondary | ICD-10-CM | POA: Diagnosis not present

## 2019-11-19 MED ORDER — SILDENAFIL CITRATE 20 MG PO TABS: ORAL_TABLET | ORAL | 3 refills | Status: DC

## 2019-11-19 MED ORDER — CLOMIPHENE CITRATE 50 MG PO TABS: ORAL_TABLET | ORAL | 3 refills | Status: DC

## 2019-11-19 MED ORDER — SILDENAFIL CITRATE 100 MG PO TABS: 100.0000 mg | ORAL_TABLET | Freq: Every day | ORAL | 3 refills | Status: DC | PRN

## 2019-11-19 NOTE — Progress Notes (Signed)
11/19/2019 12:50 PM  Raymond Flowers [birth year] [id number]  Referring Provider: Rusty Aus, MD [facility] [place],  [place] [address]  Chief Complaint  Patient presents with  . Follow-up   HPI:  Raymond Flowers is a 64 year old male with erectile dysfunction, testosterone deficiency and BPH with LUTS who presents today for 6 month follow-up.   Testosterone deficiency  He is having an occasional spontaneous erections at night. He does not have sleep apnea. He is reporting obesity. His current testosterone level is 555 ng/dL on 11/17/2019.  HCT 46.9 %, Hgb 15.7 and LFT's are WNL.  He is currently managing his testosterone deficiency with Clomid 50 mg, 1 tablet daily.   Erectile dysfunction  Patient's SHIM score is 21, which is mild erectile dysfunction. His previous SHIM score was 19.   He denies any penile curvature or pain with erections.   He takes the 100 mg Viagra when he feels fatigued and the 20 mg sildenafil (2 to 3 tablets) when he has more energy.   SHIM    Row Name 11/19/19 1110         SHIM: Over the last 6 months:   How do you rate your confidence that you could get and keep an erection?  Moderate     When you had erections with sexual stimulation, how often were your erections hard enough for penetration (entering your partner)?  Almost Always or Always     During sexual intercourse, how often were you able to maintain your erection after you had penetrated (entered) your partner?  Most Times (much more than half the time)     During sexual intercourse, how difficult was it to maintain your erection to completion of intercourse?  Slightly Difficult     When you attempted sexual intercourse, how often was it satisfactory for you?  Almost Always or Always       SHIM Total Score   SHIM  21       Score: 1-7 Severe ED 8-11 Moderate ED 12-16 Mild-Moderate ED 17-21 Mild ED 22-25 No ED  BPH with LUTS  His IPSS score  today is 4, which is mild lower urinary tract symptomatology. He is pleased with his life due to his urinary symptoms. His previous IPSS score was 3/1.  He is not experiencing dysuria, gross hematuria or suprapubic pain. He also denies any recent fevers, chills, nausea, vomiting or urinary tract infections.   IPSS    Row Name 11/19/19 1100         International Prostate Symptom Score   How often have you had the sensation of not emptying your bladder?  Not at All     How often have you had to urinate less than every two hours?  Less than half the time     How often have you found you stopped and started again several times when you urinated?  Not at All     How often have you found it difficult to postpone urination?  Not at All     How often have you had a weak urinary stream?  Not at All     How often have you had to strain to start urination?  Not at All     How many times did you typically get up at night to urinate?  2 Times     Total IPSS Score  4       Quality of Life due to  urinary symptoms   If you were to spend the rest of your life with your urinary condition just the way it is now how would you feel about that?  Pleased       Score:  1-7 Mild 8-19 Moderate 20-35 Severe  Past Medical History:  Diagnosis Date  . Androgen deficiency   . Benign enlargement of prostate   . Blood in semen   . BPH (benign prostatic hyperplasia)   . Erectile dysfunction   . Hypertension   . Impotence   . Over weight    Past Surgical History:  Procedure Laterality Date  . KNEE ARTHROSCOPY Right 2016  . SHOULDER SURGERY  2009   right  shoulder x 2  (20016)  . TONSILLECTOMY    . tricep surgery Left 2015  . VARICOCELE EXCISION     Allergies as of 11/19/2019      Reactions   Codeine Other (See Comments)   Reaction:  Hallucinations      Medication List       Accurate as of November 19, 2019 12:50 PM. If you have any questions, ask your nurse or doctor.        aspirin 81 MG  tablet Take 81 mg by mouth daily.   clomiPHENE 50 MG tablet Commonly known as: CLOMID TAKE ONE TABLET BY MOUTH DAILY   Fish Oil 1000 MG Caps Take by mouth. Reported on 10/17/2015   hydrochlorothiazide 25 MG tablet Commonly known as: HYDRODIURIL TK 1 T PO QD   naproxen 500 MG tablet Commonly known as: NAPROSYN Reported on 10/17/2015   Shingrix injection Generic drug: Zoster Vaccine Adjuvanted Shingrix (PF) 50 mcg/0.5 mL intramuscular suspension, kit  ADM 0.5 ML I UTD   sildenafil 100 MG tablet Commonly known as: VIAGRA Take 1 tablet (100 mg total) by mouth daily as needed for erectile dysfunction. Take two hours prior to intercourse on an empty stomach   sildenafil 20 MG tablet Commonly known as: REVATIO TAKE 3-5 TABLETS BY MOUTH DAILY AS NEEDED   Transderm-Scop (1.5 MG) 1 MG/3DAYS Generic drug: scopolamine APPLY 1 PATCH TO SKIN Q 3 DAYS   vitamin B-12 1000 MCG tablet Commonly known as: CYANOCOBALAMIN Take by mouth.       Allergies  Allergen Reactions  . Codeine Other (See Comments)    Reaction:  Hallucinations    Family History  Problem Relation Age of Onset  . Heart disease Mother 7  . Hypertension Mother   . Heart disease Father 66       MI   . Kidney Stones Father        mother  . Hypertension Brother   . Prostate cancer Neg Hx   . Kidney disease Neg Hx   . Kidney cancer Neg Hx   . Bladder Cancer Neg Hx    Social History: reports that he has never smoked. He has never used smokeless tobacco. He reports current alcohol use of about 2.0 standard drinks of alcohol per week. He reports that he does not use drugs.   Pertinent ROS in HPI  Physical Exam: BP (!) 150/91   Pulse 83   Ht 5' 10" (1.778 m)   Wt 204 lb (92.5 kg)   BMI 29.27 kg/m   Constitutional:  Well nourished. Alert and oriented, No acute distress. HEENT: [place] AT, mask in place.  Trachea midline, no masses. Cardiovascular: No clubbing, cyanosis, or edema. Respiratory: Normal  respiratory effort, no increased work of breathing. GI: Abdomen is soft, non  tender, non distended, no abdominal masses. Liver and spleen not palpable.  No hernias appreciated.  Stool sample for occult testing is not indicated.   GU: No CVA tenderness.  No bladder fullness or masses.  Patient with circumcised phallus.  Urethral meatus is patent.  No penile discharge. No penile lesions or rashes. Scrotum without lesions, cysts, rashes and/or edema.  Testicles are located scrotally bilaterally. No masses are appreciated in the testicles. Left and right epididymis are normal. Rectal: Patient with  normal sphincter tone. Anus and perineum without scarring or rashes. No rectal masses are appreciated. Prostate is approximately 60 grams, could only palpate the apex and the midportion of the gland, no nodules are appreciated. Seminal vesicles could not be palpated.  Skin: No rashes, bruises or suspicious lesions. Lymph: No cervical or inguinal adenopathy. Neurologic: Grossly intact, no focal deficits, moving all 4 extremities. Psychiatric: Normal mood and affect.   Laboratory Results Lab Results  Component Value Date   HGB 16.9 11/17/2019   HCT 47.1 11/17/2019   PSA history:  1.1 ng/mL on 09/03/2013  1.2 ng/mL on 03/04/2014  1.2 ng/mL on 10/04/2014  Component     Latest Ref Rng & Units 04/12/2015 10/13/2015 03/26/2016 07/24/2016  Prostate Specific Ag, Serum     0.0 - 4.0 ng/mL 1.6 1.4 1.4 1.2   Component     Latest Ref Rng & Units 10/24/2016     Prostate Specific Ag, Serum     0.0 - 4.0 ng/mL 1.2       Component     Latest Ref Rng & Units 04/22/2017 10/02/2017 05/06/2018 06/13/2018  Prostate Specific Ag, Serum     0.0 - 4.0 ng/mL 1.5 1.3 2.0 1.3   Component     Latest Ref Rng & Units 09/05/2018 11/10/2018 05/13/2019 11/17/2019  Prostate Specific Ag, Serum     0.0 - 4.0 ng/mL 1.3 1.8 1.3 1.3   Assessment & Plan:   1. Testosterone deficiency  - Testosterone at therapeutic levels  - Continue  Clomid 50 mg 1 tablet daily; prescription sent to pharmacy  - RTC in 6 months for testosterone before 10 am, LFT's, HCT, PSA and Hgb   2. BPH with LUTS  - IPSS score is 4/1, it has slightly worsened  - Continue conservative management, avoiding bladder irritants and timed voiding's  - RTC in 6 months for IPSS, PSA and exam, as testosterone therapy can cause prostate enlargement and worsen LUTS   3. Erectile dysfunction - SHIM score is 21, it is slightly improved - Patient admits that he is currently pleased with managing his ED on Viagra 100 mg with salvage 20 mg sildenafil - RTC in 6 months for SHIM score and exam, as testosterone therapy can affect erections   Return in about 6 months (around 05/18/2020) for PSA, testosterone (before 10 am) H & H, IPSS, SHIM and exam.   Zara Council, [facility]  [place]  [place] [address]  [place], [place] [address]  [phone]

## 2019-11-24 ENCOUNTER — Ambulatory Visit: Payer: Self-pay | Attending: Internal Medicine

## 2019-11-24 DIAGNOSIS — Z23 Encounter for immunization: Secondary | ICD-10-CM

## 2019-11-24 NOTE — Progress Notes (Signed)
   Covid-19 Vaccination Clinic  Name:  Raymond Flowers    MRN: [id number] DOB: 27-Apr-1956  11/24/2019  [name] was observed post Covid-19 immunization for 15 minutes without incident. He was provided with Vaccine Information Sheet and instruction to access the V-Safe system.   [name] was instructed to call 911 with any severe reactions post vaccine: Marland Kitchen Difficulty breathing  . Swelling of face and throat  . A fast heartbeat  . A bad rash all over body  . Dizziness and weakness   Immunizations Administered    Name Date Dose VIS Date Route   Pfizer COVID-19 Vaccine 11/24/2019 12:12 PM 0.3 mL 09/04/2019 Intramuscular   Manufacturer: [place]   Lot: [id number]   [place]: [id number]

## 2019-12-15 ENCOUNTER — Ambulatory Visit: Payer: Self-pay

## 2019-12-18 ENCOUNTER — Ambulatory Visit: Payer: Self-pay | Attending: Internal Medicine

## 2019-12-18 DIAGNOSIS — Z23 Encounter for immunization: Secondary | ICD-10-CM

## 2019-12-18 NOTE — Progress Notes (Signed)
   Covid-19 Vaccination Clinic  Name:  Raymond Flowers    MRN: [id number] DOB: 03/13/1956  12/18/2019  [name] was observed post Covid-19 immunization for 15 minutes without incident. He was provided with Vaccine Information Sheet and instruction to access the V-Safe system.   [name] was instructed to call 911 with any severe reactions post vaccine: Marland Kitchen Difficulty breathing  . Swelling of face and throat  . A fast heartbeat  . A bad rash all over body  . Dizziness and weakness   Immunizations Administered    Name Date Dose VIS Date Route   Pfizer COVID-19 Vaccine 12/18/2019 10:42 AM 0.3 mL 09/04/2019 Intramuscular   Manufacturer: [place]   Lot: [id number]   [place]: [id number]

## 2020-03-29 DIAGNOSIS — Z125 Encounter for screening for malignant neoplasm of prostate: Secondary | ICD-10-CM | POA: Diagnosis not present

## 2020-03-29 DIAGNOSIS — Z0000 Encounter for general adult medical examination without abnormal findings: Secondary | ICD-10-CM | POA: Diagnosis not present

## 2020-03-29 DIAGNOSIS — E538 Deficiency of other specified B group vitamins: Secondary | ICD-10-CM | POA: Diagnosis not present

## 2020-03-31 DIAGNOSIS — B078 Other viral warts: Secondary | ICD-10-CM | POA: Diagnosis not present

## 2020-03-31 DIAGNOSIS — L578 Other skin changes due to chronic exposure to nonionizing radiation: Secondary | ICD-10-CM | POA: Diagnosis not present

## 2020-03-31 DIAGNOSIS — Z85820 Personal history of malignant melanoma of skin: Secondary | ICD-10-CM | POA: Diagnosis not present

## 2020-03-31 DIAGNOSIS — Z872 Personal history of diseases of the skin and subcutaneous tissue: Secondary | ICD-10-CM | POA: Diagnosis not present

## 2020-04-05 DIAGNOSIS — Z0000 Encounter for general adult medical examination without abnormal findings: Secondary | ICD-10-CM | POA: Diagnosis not present

## 2020-04-05 DIAGNOSIS — Z23 Encounter for immunization: Secondary | ICD-10-CM | POA: Diagnosis not present

## 2020-05-05 ENCOUNTER — Other Ambulatory Visit: Payer: Self-pay

## 2020-05-05 ENCOUNTER — Other Ambulatory Visit: Payer: Medicare HMO

## 2020-05-05 DIAGNOSIS — E349 Endocrine disorder, unspecified: Secondary | ICD-10-CM

## 2020-05-05 DIAGNOSIS — N401 Enlarged prostate with lower urinary tract symptoms: Secondary | ICD-10-CM | POA: Diagnosis not present

## 2020-05-06 LAB — HEPATIC FUNCTION PANEL
ALT: 14 IU/L (ref 0–44)
AST: 14 IU/L (ref 0–40)
Albumin: 4.1 g/dL (ref 3.8–4.8)
Alkaline Phosphatase: 41 IU/L — ABNORMAL LOW (ref 48–121)
Bilirubin Total: 0.5 mg/dL (ref 0.0–1.2)
Bilirubin, Direct: 0.12 mg/dL (ref 0.00–0.40)
Total Protein: 6.8 g/dL (ref 6.0–8.5)

## 2020-05-06 LAB — HEMOGLOBIN: Hemoglobin: 16.3 g/dL (ref 13.0–17.7)

## 2020-05-06 LAB — PSA: Prostate Specific Ag, Serum: 1.3 ng/mL (ref 0.0–4.0)

## 2020-05-06 LAB — HEMATOCRIT: Hematocrit: 49 % (ref 37.5–51.0)

## 2020-05-06 LAB — TESTOSTERONE: Testosterone: 606 ng/dL (ref 264–916)

## 2020-05-11 DIAGNOSIS — D485 Neoplasm of uncertain behavior of skin: Secondary | ICD-10-CM | POA: Diagnosis not present

## 2020-05-11 DIAGNOSIS — C44329 Squamous cell carcinoma of skin of other parts of face: Secondary | ICD-10-CM | POA: Diagnosis not present

## 2020-05-13 ENCOUNTER — Other Ambulatory Visit: Payer: Self-pay

## 2020-05-17 NOTE — Progress Notes (Signed)
05/18/2020 1:59 PM  Raymond Flowers Sep 03, 1956 [id number]  Referring Provider: Rusty Aus, MD [facility] [place],  [place] [address]  Chief Complaint  Patient presents with  . Benign Prostatic Hypertrophy   HPI:  Raymond Flowers is a 64 year old male with erectile dysfunction, testosterone deficiency and BPH with LUTS who presents today for 6 month follow-up.   Testosterone deficiency  He is having an occasional spontaneous erections at night. He does not have sleep apnea. He is reporting obesity.  He is currently managing his testosterone deficiency with Clomid 50 mg, 1 tablet daily.    Component     Latest Ref Rng & Units 05/05/2020  Testosterone     264 - 916 ng/dL 606   Component     Latest Ref Rng & Units 05/05/2020  HCT     37.5 - 51.0 % 49.0   Component     Latest Ref Rng & Units 05/05/2020  Hemoglobin     13.0 - 17.7 g/dL 16.3   Component     Latest Ref Rng & Units 05/05/2020  Total Protein     6.0 - 8.5 g/dL 6.8  Albumin     3.8 - 4.8 g/dL 4.1  Total Bilirubin     0.0 - 1.2 mg/dL 0.5  BILIRUBIN, DIRECT     0.00 - 0.40 mg/dL 0.12  Alkaline Phosphatase     48 - 121 IU/L 41 (L)  AST     0 - 40 IU/L 14  ALT     0 - 44 IU/L 14    Erectile dysfunction  Patient's SHIM score is 22, which is no erectile dysfunction. His previous SHIM score was 21.   He denies any penile curvature or pain with erections.   He takes the 100 mg Viagra when he feels fatigued and the 20 mg sildenafil (2 to 3 tablets) when he has more energy.    SHIM    Row Name 05/18/20 1318         SHIM: Over the last 6 months:   How do you rate your confidence that you could get and keep an erection? Moderate     When you had erections with sexual stimulation, how often were your erections hard enough for penetration (entering your partner)? Almost Always or Always     During sexual intercourse, how often were you able to maintain your erection  after you had penetrated (entered) your partner? Almost Always or Always     During sexual intercourse, how difficult was it to maintain your erection to completion of intercourse? Slightly Difficult     When you attempted sexual intercourse, how often was it satisfactory for you? Almost Always or Always       SHIM Total Score   SHIM 22           Score: 1-7 Severe ED 8-11 Moderate ED 12-16 Mild-Moderate ED 17-21 Mild ED 22-25 No ED  BPH with LUTS  His IPSS score today is 5, which is mild lower urinary tract symptomatology. He is pleased with his life due to his urinary symptoms. His previous IPSS score was 4/1.  He is not experiencing dysuria, gross hematuria or suprapubic pain. He also denies any recent fevers, chills, nausea, vomiting or urinary tract infections.    IPSS    Row Name 05/18/20 1300         International Prostate Symptom Score   How often have you had  the sensation of not emptying your bladder? Not at All     How often have you had to urinate less than every two hours? Less than half the time     How often have you found you stopped and started again several times when you urinated? Not at All     How often have you found it difficult to postpone urination? Not at All     How often have you had a weak urinary stream? Not at All     How often have you had to strain to start urination? Not at All     How many times did you typically get up at night to urinate? 3 Times     Total IPSS Score 5       Quality of Life due to urinary symptoms   If you were to spend the rest of your life with your urinary condition just the way it is now how would you feel about that? Pleased           Score:  1-7 Mild 8-19 Moderate 20-35 Severe   Patient has had incidences of microscopic hematuria on his UA's with his PCP.  The most recent being 4-10 RBC's in July of this year.  His UA today is negative for micro heme.    PMH: Past Medical History:  Diagnosis Date  .  Androgen deficiency   . Benign enlargement of prostate   . Blood in semen   . BPH (benign prostatic hyperplasia)   . Erectile dysfunction   . Hypertension   . Impotence   . Over weight    Past Surgical History:  Procedure Laterality Date  . KNEE ARTHROSCOPY Right 2016  . SHOULDER SURGERY  2009   right  shoulder x 2  (20016)  . TONSILLECTOMY    . tricep surgery Left 2015  . VARICOCELE EXCISION     Allergies as of 05/18/2020      Reactions   Codeine Other (See Comments)   Reaction:  Hallucinations      Medication List       Accurate as of May 18, 2020  1:59 PM. If you have any questions, ask your nurse or doctor.        aspirin 81 MG tablet Take 81 mg by mouth daily.   clomiPHENE 50 MG tablet Commonly known as: CLOMID TAKE ONE TABLET BY MOUTH DAILY   Fish Oil 1000 MG Caps Take by mouth. Reported on 10/17/2015   hydrochlorothiazide 25 MG tablet Commonly known as: HYDRODIURIL TK 1 T PO QD   naproxen 500 MG tablet Commonly known as: NAPROSYN Reported on 10/17/2015   Shingrix injection Generic drug: Zoster Vaccine Adjuvanted Shingrix (PF) 50 mcg/0.5 mL intramuscular suspension, kit  ADM 0.5 ML I UTD   sildenafil 100 MG tablet Commonly known as: VIAGRA Take 1 tablet (100 mg total) by mouth daily as needed for erectile dysfunction. Take two hours prior to intercourse on an empty stomach   sildenafil 20 MG tablet Commonly known as: REVATIO TAKE 3-5 TABLETS BY MOUTH DAILY AS NEEDED   Transderm-Scop (1.5 MG) 1 MG/3DAYS Generic drug: scopolamine APPLY 1 PATCH TO SKIN Q 3 DAYS   vitamin B-12 1000 MCG tablet Commonly known as: CYANOCOBALAMIN Take by mouth.       Allergies  Allergen Reactions  . Codeine Other (See Comments)    Reaction:  Hallucinations    Family History  Problem Relation Age of Onset  . Heart disease Mother  9  . Hypertension Mother   . Heart disease Father 66       MI   . Kidney Stones Father        mother  . Hypertension  Brother   . Prostate cancer Neg Hx   . Kidney disease Neg Hx   . Kidney cancer Neg Hx   . Bladder Cancer Neg Hx    Social History: reports that he has never smoked. He has never used smokeless tobacco. He reports current alcohol use of about 2.0 standard drinks of alcohol per week. He reports that he does not use drugs.   Pertinent ROS in HPI  Physical Exam: BP (!) 150/87 (BP Location: Left Arm, Patient Position: Sitting, Cuff Size: Normal)   Pulse 70   Ht 5' 9" (1.753 m)   Wt 195 lb 4.8 oz (88.6 kg)   BMI 28.84 kg/m   Constitutional:  Well nourished. Alert and oriented, No acute distress. HEENT: [place] AT, mask in place.  Trachea midline Cardiovascular: No clubbing, cyanosis, or edema. Respiratory: Normal respiratory effort, no increased work of breathing. GU: No CVA tenderness.  No bladder fullness or masses.  Patient with circumcised phallus. Urethral meatus is patent.  No penile discharge. No penile lesions or rashes. Scrotum without lesions, cysts, rashes and/or edema.  Testicles are located scrotally bilaterally. No masses are appreciated in the testicles. Left and right epididymis are normal. Rectal: Patient with  normal sphincter tone. Anus and perineum without scarring or rashes. No rectal masses are appreciated. Prostate is approximately 60 grams, could only palpate the apex and midportion of the gland, no nodules are appreciated. Seminal vesicles could not be palpated Skin: No rashes, bruises or suspicious lesions. Lymph: No inguinal adenopathy. Neurologic: Grossly intact, no focal deficits, moving all 4 extremities. Psychiatric: Normal mood and affect.   Laboratory Results Urinalysis Component     Latest Ref Rng & Units 05/18/2020  Specific Gravity, UA     1.005 - 1.030 >1.030 (H)  pH, UA     5.0 - 7.5 5.5  Color, UA     Yellow Yellow  Appearance Ur     Clear Clear  Leukocytes,UA     Negative Negative  Protein,UA     Negative/Trace Negative  Glucose, UA      Negative Negative  Ketones, UA     Negative Negative  RBC, UA     Negative Negative  Bilirubin, UA     Negative Negative  Urobilinogen, Ur     0.2 - 1.0 mg/dL 0.2  Nitrite, UA     Negative Negative  Microscopic Examination      See below:   Component     Latest Ref Rng & Units 05/18/2020  WBC, UA     0 - 5 /hpf 0-5  RBC     0 - 2 /hpf 0-2  Epithelial Cells (non renal)     0 - 10 /hpf 0-10  Bacteria, UA     None seen/Few None seen   Specimen:  Urine at PCP's   Ref Range & Units 1 mo ago  Color Yellow  Yellow      Clarity Clear  Clear      Specific Gravity 1.000 - 1.030  1.020      pH, Urine 5.0 - 8.0  6.0      Protein, Urinalysis Negative, Trace mg/dL Negative      Glucose, Urinalysis Negative mg/dL Negative      Ketones, Urinalysis Negative mg/dL Negative  Blood, Urinalysis Negative  ModerateAbnormal      Nitrite, Urinalysis Negative  Negative      Leukocyte Esterase, Urinalysis Negative  Negative      White Blood Cells, Urinalysis None Seen, 0-3 /hpf None Seen      Red Blood Cells, Urinalysis None Seen, 0-3 /hpf 4-10Abnormal      Bacteria, Urinalysis None Seen /hpf None Seen      Squamous Epithelial Cells, Urinalysis Rare, Few, None Seen /hpf None Seen      Resulting Agency  [facility]  Specimen Collected: 03/29/20 7:15 AM Last Resulted: 03/29/20 8:48 AM  Received From: [facility]  Result Received: 05/04/20 3:21 PM    Lab Results  Component Value Date   HGB 16.3 05/05/2020   HCT 49.0 05/05/2020   PSA history:  1.1 ng/mL on 09/03/2013  1.2 ng/mL on 03/04/2014  1.2 ng/mL on 10/04/2014  Component     Latest Ref Rng & Units 04/12/2015 10/13/2015 03/26/2016 07/24/2016  Prostate Specific Ag, Serum     0.0 - 4.0 ng/mL 1.6 1.4 1.4 1.2   Component     Latest Ref Rng & Units 10/24/2016     Prostate Specific Ag, Serum     0.0 - 4.0 ng/mL 1.2       Component     Latest Ref Rng & Units 04/22/2017 10/02/2017  05/06/2018 06/13/2018  Prostate Specific Ag, Serum     0.0 - 4.0 ng/mL 1.5 1.3 2.0 1.3   Component     Latest Ref Rng & Units 09/05/2018 11/10/2018 05/13/2019 11/17/2019  Prostate Specific Ag, Serum     0.0 - 4.0 ng/mL 1.3 1.8 1.3 1.3   Component     Latest Ref Rng & Units 05/05/2020  Prostate Specific Ag, Serum     0.0 - 4.0 ng/mL 1.3   Assessment & Plan:   1. Microscopic hematuria - I explained to the patient that there are a number of causes that can be associated with blood in the urine, such as stones, BPH, UTI's, damage to the urinary tract and/or cancer. - At this time, he meets the stratification for high risk hematuria - I explained to the patient that a contrast material will be injected into a vein and that in rare instances, an allergic reaction can result and may even life threatening (1:100,000)  The patient denies any allergies to contrast, iodine and/or seafood and is not taking metformin - Following the imaging study,  I've recommended a cystoscopy. I described how this is performed, typically in an office setting with a flexible cystoscope. We described the risks, benefits, and possible side effects, the most common of which is a minor amount of blood in the urine and/or burning which usually resolves in 24 to 48 hours.   - The patient had the opportunity to ask questions which were answered. Based upon this discussion, the patient is willing to proceed. Therefore, I've ordered: a CT Urogram and cystoscopy. - The patient will return following all of the above for discussion of the results.  - UA - Urine culture   2. Testosterone deficiency  - Testosterone at therapeutic levels  - Continue Clomid 50 mg 1 tablet daily; prescription sent to pharmacy  - RTC in 6 months for testosterone before 10 am, LFT's, HCT, PSA and Hgb   3. BPH with LUTS  - IPSS score is 5/1, it has slightly worsened  - Continue conservative management, avoiding bladder irritants and timed voiding's  -  RTC in 6 months for IPSS, PSA and exam, as testosterone therapy can cause prostate enlargement and worsen LUTS   4. Erectile dysfunction - SHIM score is 22, it is slightly improved - Patient admits that he is currently pleased with managing his ED on Viagra 100 mg with salvage 20 mg sildenafil - RTC in 6 months for SHIM score and exam, as testosterone therapy can affect erections   Return for CT Urogram report and cystoscopy.   Zara Council, PA-C  [facility]  [address] [place]  [place], [place] [address]  [phone]

## 2020-05-18 ENCOUNTER — Other Ambulatory Visit: Payer: Self-pay

## 2020-05-18 ENCOUNTER — Ambulatory Visit (INDEPENDENT_AMBULATORY_CARE_PROVIDER_SITE_OTHER): Payer: Medicare HMO | Admitting: Urology

## 2020-05-18 ENCOUNTER — Encounter: Payer: Self-pay | Admitting: Urology

## 2020-05-18 VITALS — BP 150/87 | HR 70 | Ht 69.0 in | Wt 195.3 lb

## 2020-05-18 DIAGNOSIS — R3129 Other microscopic hematuria: Secondary | ICD-10-CM

## 2020-05-18 DIAGNOSIS — N401 Enlarged prostate with lower urinary tract symptoms: Secondary | ICD-10-CM | POA: Diagnosis not present

## 2020-05-18 DIAGNOSIS — E349 Endocrine disorder, unspecified: Secondary | ICD-10-CM | POA: Diagnosis not present

## 2020-05-18 DIAGNOSIS — N529 Male erectile dysfunction, unspecified: Secondary | ICD-10-CM | POA: Diagnosis not present

## 2020-05-18 DIAGNOSIS — N138 Other obstructive and reflux uropathy: Secondary | ICD-10-CM

## 2020-05-18 MED ORDER — CLOMIPHENE CITRATE 50 MG PO TABS: ORAL_TABLET | ORAL | 3 refills | Status: DC

## 2020-05-19 LAB — MICROSCOPIC EXAMINATION: Bacteria, UA: NONE SEEN

## 2020-05-19 LAB — URINALYSIS, COMPLETE
Bilirubin, UA: NEGATIVE
Glucose, UA: NEGATIVE
Ketones, UA: NEGATIVE
Leukocytes,UA: NEGATIVE
Nitrite, UA: NEGATIVE
Protein,UA: NEGATIVE
RBC, UA: NEGATIVE
Specific Gravity, UA: >1.03 — ABNORMAL HIGH (ref 1.005–1.030)
Urobilinogen, Ur: 0.2 mg/dL (ref 0.2–1.0)
pH, UA: 5.5 (ref 5.0–7.5)

## 2020-05-21 LAB — CULTURE, URINE COMPREHENSIVE

## 2020-06-07 ENCOUNTER — Ambulatory Visit
Admission: RE | Admit: 2020-06-07 | Discharge: 2020-06-07 | Disposition: A | Discharge status: Home / Self Care | Payer: Medicare HMO | Source: Ambulatory Visit | Attending: Urology | Admitting: Urology

## 2020-06-07 ENCOUNTER — Other Ambulatory Visit: Payer: Self-pay

## 2020-06-07 DIAGNOSIS — N200 Calculus of kidney: Secondary | ICD-10-CM | POA: Diagnosis not present

## 2020-06-07 DIAGNOSIS — R3129 Other microscopic hematuria: Secondary | ICD-10-CM | POA: Insufficient documentation

## 2020-06-07 DIAGNOSIS — N281 Cyst of kidney, acquired: Secondary | ICD-10-CM | POA: Diagnosis not present

## 2020-06-07 DIAGNOSIS — K5730 Diverticulosis of large intestine without perforation or abscess without bleeding: Secondary | ICD-10-CM | POA: Diagnosis not present

## 2020-06-07 HISTORY — DX: Basal cell carcinoma of skin, unspecified: C44.91

## 2020-06-07 LAB — POCT I-STAT CREATININE: Creatinine, Ser: 1.4 mg/dL — ABNORMAL HIGH (ref 0.61–1.24)

## 2020-06-07 MED ORDER — IOHEXOL 300 MG/ML  SOLN
100.0000 mL | Freq: Once | INTRAMUSCULAR | Status: AC | PRN
  Administered 2020-06-07: 100 mL via INTRAVENOUS

## 2020-06-07 MED ORDER — IOHEXOL 300 MG/ML  SOLN: 125.0000 mL | Freq: Once | INTRAMUSCULAR | Status: DC | PRN

## 2020-06-08 ENCOUNTER — Ambulatory Visit: Payer: Medicare HMO | Admitting: Urology

## 2020-06-08 ENCOUNTER — Encounter: Payer: Self-pay | Admitting: Urology

## 2020-06-08 VITALS — BP 162/83 | HR 67 | Ht 69.5 in | Wt 195.0 lb

## 2020-06-08 DIAGNOSIS — N401 Enlarged prostate with lower urinary tract symptoms: Secondary | ICD-10-CM | POA: Diagnosis not present

## 2020-06-08 DIAGNOSIS — R3121 Asymptomatic microscopic hematuria: Secondary | ICD-10-CM

## 2020-06-08 DIAGNOSIS — N138 Other obstructive and reflux uropathy: Secondary | ICD-10-CM | POA: Diagnosis not present

## 2020-06-08 NOTE — Progress Notes (Signed)
Cystoscopy Procedure Note:  Indication: Microscopic hematuria  After informed consent and discussion of the procedure and its risks, Raymond Flowers was positioned and prepped in the standard fashion. Cystoscopy was performed with a flexible cystoscope. The urethra, bladder neck and entire bladder was visualized in a standard fashion. The prostate was moderate with lateral lobe hypertrophy, no significant median lobe or intravesical protrusion. The ureteral orifices were visualized in their normal location and orientation.  No abnormalities on retroflexion.  Bladder mucosa grossly normal throughout.  Imaging: I personally reviewed the CT showing small left-sided nonobstructive stone burden, and benign-appearing left renal cysts, no filling defects, hydronephrosis, or other abnormalities.  Prostate measures 50 g.  Findings: Normal cystoscopy  Assessment and Plan: Continue follow-up with Larene Beach regarding ED and low testosterone  Nickolas Madrid, MD 06/08/2020

## 2020-06-08 NOTE — Patient Instructions (Signed)
Dietary Guidelines to Help Prevent Kidney Stones Kidney stones are deposits of minerals and salts that form inside your kidneys. Your risk of developing kidney stones may be greater depending on your diet, your lifestyle, the medicines you take, and whether you have certain medical conditions. Most people can reduce their chances of developing kidney stones by following the instructions below. Depending on your overall health and the type of kidney stones you tend to develop, your dietitian may give you more specific instructions. What are tips for following this plan? Reading food labels  Choose foods with "no salt added" or "low-salt" labels. Limit your sodium intake to less than 1500 mg per day.  Choose foods with calcium for each meal and snack. Try to eat about 300 mg of calcium at each meal. Foods that contain 200-500 mg of calcium per serving include: ? 8 oz (237 ml) of milk, fortified nondairy milk, and fortified fruit juice. ? 8 oz (237 ml) of kefir, yogurt, and soy yogurt. ? 4 oz (118 ml) of tofu. ? 1 oz of cheese. ? 1 cup (300 g) of dried figs. ? 1 cup (91 g) of cooked broccoli. ? 1-3 oz can of sardines or mackerel.  Most people need 1000 to 1500 mg of calcium each day. Talk to your dietitian about how much calcium is recommended for you. Shopping  Buy plenty of fresh fruits and vegetables. Most people do not need to avoid fruits and vegetables, even if they contain nutrients that may contribute to kidney stones.  When shopping for convenience foods, choose: ? Whole pieces of fruit. ? Premade salads with dressing on the side. ? Low-fat fruit and yogurt smoothies.  Avoid buying frozen meals or prepared deli foods.  Look for foods with live cultures, such as yogurt and kefir. Cooking  Do not add salt to food when cooking. Place a salt shaker on the table and allow each person to add his or her own salt to taste.  Use vegetable protein, such as beans, textured vegetable  protein (TVP), or tofu instead of meat in pasta, casseroles, and soups. Meal planning   Eat less salt, if told by your dietitian. To do this: ? Avoid eating processed or premade food. ? Avoid eating fast food.  Eat less animal protein, including cheese, meat, poultry, or fish, if told by your dietitian. To do this: ? Limit the number of times you have meat, poultry, fish, or cheese each week. Eat a diet free of meat at least 2 days a week. ? Eat only one serving each day of meat, poultry, fish, or seafood. ? When you prepare animal protein, cut pieces into small portion sizes. For most meat and fish, one serving is about the size of one deck of cards.  Eat at least 5 servings of fresh fruits and vegetables each day. To do this: ? Keep fruits and vegetables on hand for snacks. ? Eat 1 piece of fruit or a handful of berries with breakfast. ? Have a salad and fruit at lunch. ? Have two kinds of vegetables at dinner.  Limit foods that are high in a substance called oxalate. These include: ? Spinach. ? Rhubarb. ? Beets. ? Potato chips and french fries. ? Nuts.  If you regularly take a diuretic medicine, make sure to eat at least 1-2 fruits or vegetables high in potassium each day. These include: ? Avocado. ? Banana. ? Orange, prune, carrot, or tomato juice. ? Baked potato. ? Cabbage. ? Beans and split   peas. General instructions   Drink enough fluid to keep your urine clear or pale yellow. This is the most important thing you can do.  Talk to your health care provider and dietitian about taking daily supplements. Depending on your health and the cause of your kidney stones, you may be advised: ? Not to take supplements with vitamin C. ? To take a calcium supplement. ? To take a daily probiotic supplement. ? To take other supplements such as magnesium, fish oil, or vitamin B6.  Take all medicines and supplements as told by your health care provider.  Limit alcohol intake to no  more than 1 drink a day for nonpregnant women and 2 drinks a day for men. One drink equals 12 oz of beer, 5 oz of wine, or 1 oz of hard liquor.  Lose weight if told by your health care provider. Work with your dietitian to find strategies and an eating plan that works best for you. What foods are not recommended? Limit your intake of the following foods, or as told by your dietitian. Talk to your dietitian about specific foods you should avoid based on the type of kidney stones and your overall health. Grains Breads. Bagels. Rolls. Baked goods. Salted crackers. Cereal. Pasta. Vegetables Spinach. Rhubarb. Beets. Canned vegetables. Pickles. Olives. Meats and other protein foods Nuts. Nut butters. Large portions of meat, poultry, or fish. Salted or cured meats. Deli meats. Hot dogs. Sausages. Dairy Cheese. Beverages Regular soft drinks. Regular vegetable juice. Seasonings and other foods Seasoning blends with salt. Salad dressings. Canned soups. Soy sauce. Ketchup. Barbecue sauce. Canned pasta sauce. Casseroles. Pizza. Lasagna. Frozen meals. Potato chips. French fries. Summary  You can reduce your risk of kidney stones by making changes to your diet.  The most important thing you can do is drink enough fluid. You should drink enough fluid to keep your urine clear or pale yellow.  Ask your health care provider or dietitian how much protein from animal sources you should eat each day, and also how much salt and calcium you should have each day. This information is not intended to replace advice given to you by your health care provider. Make sure you discuss any questions you have with your health care provider. Document Revised: 12/31/2018 Document Reviewed: 08/21/2016 Elsevier Patient Education  2020 [employer]  

## 2020-06-09 LAB — URINALYSIS, COMPLETE
Bilirubin, UA: NEGATIVE
Glucose, UA: NEGATIVE
Ketones, UA: NEGATIVE
Leukocytes,UA: NEGATIVE
Nitrite, UA: NEGATIVE
Protein,UA: NEGATIVE
RBC, UA: NEGATIVE
Specific Gravity, UA: 1.025 (ref 1.005–1.030)
Urobilinogen, Ur: 0.2 mg/dL (ref 0.2–1.0)
pH, UA: 6 (ref 5.0–7.5)

## 2020-06-09 LAB — MICROSCOPIC EXAMINATION
Bacteria, UA: NONE SEEN
Epithelial Cells (non renal): NONE SEEN /hpf (ref 0–10)
RBC, Urine: NONE SEEN /hpf (ref 0–2)

## 2020-07-09 ENCOUNTER — Other Ambulatory Visit: Payer: Self-pay | Admitting: Urology

## 2020-07-13 DIAGNOSIS — C44329 Squamous cell carcinoma of skin of other parts of face: Secondary | ICD-10-CM | POA: Diagnosis not present

## 2020-08-02 DIAGNOSIS — Z0000 Encounter for general adult medical examination without abnormal findings: Secondary | ICD-10-CM | POA: Diagnosis not present

## 2020-08-02 DIAGNOSIS — N529 Male erectile dysfunction, unspecified: Secondary | ICD-10-CM | POA: Diagnosis not present

## 2020-09-09 DIAGNOSIS — H40003 Preglaucoma, unspecified, bilateral: Secondary | ICD-10-CM | POA: Diagnosis not present

## 2020-09-22 DIAGNOSIS — Z03818 Encounter for observation for suspected exposure to other biological agents ruled out: Secondary | ICD-10-CM | POA: Diagnosis not present

## 2020-09-22 DIAGNOSIS — Z1152 Encounter for screening for COVID-19: Secondary | ICD-10-CM | POA: Diagnosis not present

## 2020-10-03 DIAGNOSIS — Z85820 Personal history of malignant melanoma of skin: Secondary | ICD-10-CM | POA: Diagnosis not present

## 2020-10-03 DIAGNOSIS — Z85828 Personal history of other malignant neoplasm of skin: Secondary | ICD-10-CM | POA: Diagnosis not present

## 2020-10-03 DIAGNOSIS — L578 Other skin changes due to chronic exposure to nonionizing radiation: Secondary | ICD-10-CM | POA: Diagnosis not present

## 2020-10-03 DIAGNOSIS — L738 Other specified follicular disorders: Secondary | ICD-10-CM | POA: Diagnosis not present

## 2020-10-03 DIAGNOSIS — Z872 Personal history of diseases of the skin and subcutaneous tissue: Secondary | ICD-10-CM | POA: Diagnosis not present

## 2020-10-03 DIAGNOSIS — L570 Actinic keratosis: Secondary | ICD-10-CM | POA: Diagnosis not present

## 2020-10-09 ENCOUNTER — Other Ambulatory Visit: Payer: Self-pay | Admitting: Urology

## 2020-10-09 DIAGNOSIS — E349 Endocrine disorder, unspecified: Secondary | ICD-10-CM

## 2020-10-26 DIAGNOSIS — M25811 Other specified joint disorders, right shoulder: Secondary | ICD-10-CM | POA: Diagnosis not present

## 2020-11-19 ENCOUNTER — Other Ambulatory Visit: Payer: Self-pay | Admitting: Urology

## 2020-11-22 DIAGNOSIS — L237 Allergic contact dermatitis due to plants, except food: Secondary | ICD-10-CM | POA: Diagnosis not present

## 2020-11-24 DIAGNOSIS — C44329 Squamous cell carcinoma of skin of other parts of face: Secondary | ICD-10-CM | POA: Diagnosis not present

## 2020-12-05 DIAGNOSIS — M25811 Other specified joint disorders, right shoulder: Secondary | ICD-10-CM | POA: Diagnosis not present

## 2020-12-06 NOTE — Progress Notes (Signed)
12/07/2020 4:04 PM  Raymond Flowers Oct 22, 1955 [id number]  Referring Provider: Rusty Aus, MD [facility] [place],  [place] [address]  Chief Complaint  Patient presents with  . Testosterone deficiency  . Benign Prostatic Hypertrophy  . Erectile Dysfunction   Urological history: 1. Testosterone deficiency - managed with Clomid 50 mg, one tablet daily  2. ED - SHIM 21 - contributing factors of age, testosterone deficiency, BPH, HLD and pre-diabetes - managed with sildenafil 20 mg and 100 mg, on-demand-dosing  3. BPH with LU TS - I PSS 5/0 - prostate volume 50 cc  4. High risk hematuria - Non-smoker - CTU 05/2020 Bilateral nonobstructive nephrolithiasis.   Several benign-appearing left renal cysts.  Mild prostatomegaly.   - Cysto 05/2020 NED - UA negative for micro heme   HPI:  Raymond Flowers is a 65 y.o. male who presents today for a 6 month follow up.     Patient still having spontaneous erections.  He denies any pain or curvature with erections.     SHIM    Row Name 12/07/20 0946         SHIM: Over the last 6 months:   How do you rate your confidence that you could get and keep an erection? Moderate     When you had erections with sexual stimulation, how often were your erections hard enough for penetration (entering your partner)? Almost Always or Always     During sexual intercourse, how often were you able to maintain your erection after you had penetrated (entered) your partner? Most Times (much more than half the time)     During sexual intercourse, how difficult was it to maintain your erection to completion of intercourse? Slightly Difficult     When you attempted sexual intercourse, how often was it satisfactory for you? Almost Always or Always           SHIM Total Score   SHIM 21           Score: 1-7 Severe ED 8-11 Moderate ED 12-16 Mild-Moderate ED 17-21 Mild ED 22-25 No ED  He is  having nocturia x 2-3 secondary to fluid intake.  Patient denies any modifying or aggravating factors.  Patient denies any gross hematuria, dysuria or suprapubic/flank pain.  Patient denies any fevers, chills, nausea or vomiting.    IPSS    Row Name 12/07/20 0900         International Prostate Symptom Score   How often have you had the sensation of not emptying your bladder? Not at All     How often have you had to urinate less than every two hours? Less than half the time     How often have you found you stopped and started again several times when you urinated? Not at All     How often have you found it difficult to postpone urination? Not at All     How often have you had a weak urinary stream? Not at All     How often have you had to strain to start urination? Not at All     How many times did you typically get up at night to urinate? 3 Times     Total IPSS Score 5           Quality of Life due to urinary symptoms   If you were to spend the rest of your life with your urinary condition just  the way it is now how would you feel about that? Delighted           Score:  1-7 Mild 8-19 Moderate 20-35 Severe      PMH: Past Medical History:  Diagnosis Date  . Androgen deficiency   . Benign enlargement of prostate   . Blood in semen   . BPH (benign prostatic hyperplasia)   . Erectile dysfunction   . Hypertension   . Impotence   . Over weight   . Skin cancer, basal cell    Resected from Right facial area.    Past Surgical History:  Procedure Laterality Date  . KNEE ARTHROSCOPY Right 2016  . SHOULDER SURGERY  2009   right  shoulder x 2  (20016)  . TONSILLECTOMY    . tricep surgery Left 2015  . VARICOCELE EXCISION     Allergies as of 12/07/2020      Reactions   Codeine Other (See Comments)   Reaction:  Hallucinations   Oxycodone    Other reaction(s): Hallucination      Medication List       Accurate as of December 07, 2020 11:59 PM. If you have any questions,  ask your nurse or doctor.        STOP taking these medications   Fish Oil 1000 MG Caps Stopped by: Sheehan Stacey, PA-C   Transderm-Scop (1.5 MG) 1 MG/3DAYS Generic drug: scopolamine Stopped by: Zara Council, PA-C     TAKE these medications   aspirin 81 MG tablet Take 81 mg by mouth daily.   clomiPHENE 50 MG tablet Commonly known as: CLOMID Take 1 tablet (50 mg total) by mouth daily.   escitalopram 10 MG tablet Commonly known as: LEXAPRO Take by mouth.   hydrochlorothiazide 25 MG tablet Commonly known as: HYDRODIURIL Take 25 mg by mouth daily.   naproxen 500 MG tablet Commonly known as: NAPROSYN Take 500 mg by mouth as needed.   sildenafil 20 MG tablet Commonly known as: REVATIO TAKE 3-5 TABLETS BY MOUTH DAILY AS NEEDED   vitamin B-12 1000 MCG tablet Commonly known as: CYANOCOBALAMIN Take by mouth.       Allergies  Allergen Reactions  . Codeine Other (See Comments)    Reaction:  Hallucinations   . Oxycodone     Other reaction(s): Hallucination   Family History  Problem Relation Age of Onset  . Heart disease Mother 58  . Hypertension Mother   . Heart disease Father 26       MI   . Kidney Stones Father        mother  . Hypertension Brother   . Prostate cancer Neg Hx   . Kidney disease Neg Hx   . Kidney cancer Neg Hx   . Bladder Cancer Neg Hx    Social History: reports that he has never smoked. He has never used smokeless tobacco. He reports current alcohol use of about 2.0 standard drinks of alcohol per week. He reports that he does not use drugs.   Pertinent ROS in HPI  Physical Exam: BP (!) 151/79   Pulse 65   Ht 5\' 9"  (1.753 m)   Wt 195 lb (88.5 kg)   BMI 28.80 kg/m   Constitutional:  Well nourished. Alert and oriented, No acute distress. HEENT: [place] AT, mask in place.  Trachea midline Cardiovascular: No clubbing, cyanosis, or edema. Respiratory: Normal respiratory effort, no increased work of breathing. GU: No CVA tenderness.  No  bladder fullness or masses.  Patient with circumcised phallus. Urethral meatus is patent.  No penile discharge. No penile lesions or rashes. Scrotum without lesions, cysts, rashes and/or edema.  Testicles are located scrotally bilaterally. No masses are appreciated in the testicles. Left and right epididymis are normal. Rectal: Patient with  normal sphincter tone. Anus and perineum without scarring or rashes. No rectal masses are appreciated. Prostate is approximately 50 grams, could only palpate the apex, no nodules are appreciated. Seminal vesicles could not be palpated Lymph: No inguinal adenopathy. Neurologic: Grossly intact, no focal deficits, moving all 4 extremities. Psychiatric: Normal mood and affect.   Laboratory Results PSA history:  1.1 ng/mL on 09/03/2013  1.2 ng/mL on 03/04/2014  1.2 ng/mL on 10/04/2014  Component     Latest Ref Rng & Units 04/12/2015 10/13/2015 03/26/2016 07/24/2016  Prostate Specific Ag, Serum     0.0 - 4.0 ng/mL 1.6 1.4 1.4 1.2   Component     Latest Ref Rng & Units 10/24/2016     Prostate Specific Ag, Serum     0.0 - 4.0 ng/mL 1.2       Component     Latest Ref Rng & Units 04/22/2017 10/02/2017 05/06/2018 06/13/2018  Prostate Specific Ag, Serum     0.0 - 4.0 ng/mL 1.5 1.3 2.0 1.3   Component     Latest Ref Rng & Units 09/05/2018 11/10/2018 05/13/2019 11/17/2019  Prostate Specific Ag, Serum     0.0 - 4.0 ng/mL 1.3 1.8 1.3 1.3   Component     Latest Ref Rng & Units 05/05/2020  Prostate Specific Ag, Serum     0.0 - 4.0 ng/mL 1.3   Urinalysis Component     Latest Ref Rng & Units 12/07/2020  Specific Gravity, UA     1.005 - 1.030 1.025  pH, UA     5.0 - 7.5 6.5  Color, UA     Yellow Yellow  Appearance Ur     Clear Clear  Leukocytes,UA     Negative Negative  Protein,UA     Negative/Trace Negative  Glucose, UA     Negative Negative  Ketones, UA     Negative Negative  RBC, UA     Negative Trace (A)  Bilirubin, UA     Negative Negative   Urobilinogen, Ur     0.2 - 1.0 mg/dL 0.2  Nitrite, UA     Negative Negative  Microscopic Examination      See below:   Component     Latest Ref Rng & Units 12/07/2020  WBC, UA     0 - 5 /hpf None seen  RBC     0 - 2 /hpf 0-2  Epithelial Cells (non renal)     0 - 10 /hpf 0-10  Bacteria, UA     None seen/Few None seen   I have reviewed the labs.  Assessment & Plan:   1. High risk hematuria - negative work up 05/2020 - UA negative for micro heme  2. Testosterone deficiency  - Testosterone level pending - Continue Clomid 50 mg 1 tablet daily; prescription sent to pharmacy   3. BPH with LUTS  - PSA pending  - IPSS score is improved - Continue conservative management, avoiding bladder irritants and timed voiding's   4. Erectile dysfunction - SHIM score is worse - Patient admits that he is currently pleased with managing his ED on Viagra 100 mg with salvage 20 mg sildenafil   Return in about 1 year (around 12/07/2021) for PSA,  am testosterone, H & H, LFT's , IPSS, SHIM and exam.   Zara Council, PA-C  [facility]  [address] [place]  [place], [place] [address]  [phone]

## 2020-12-07 ENCOUNTER — Ambulatory Visit: Payer: Medicare HMO | Admitting: Urology

## 2020-12-07 ENCOUNTER — Other Ambulatory Visit: Payer: Self-pay

## 2020-12-07 ENCOUNTER — Encounter: Payer: Self-pay | Admitting: Urology

## 2020-12-07 VITALS — BP 151/79 | HR 65 | Ht 69.0 in | Wt 195.0 lb

## 2020-12-07 DIAGNOSIS — R972 Elevated prostate specific antigen [PSA]: Secondary | ICD-10-CM

## 2020-12-07 DIAGNOSIS — E349 Endocrine disorder, unspecified: Secondary | ICD-10-CM | POA: Diagnosis not present

## 2020-12-07 DIAGNOSIS — R3129 Other microscopic hematuria: Secondary | ICD-10-CM | POA: Diagnosis not present

## 2020-12-07 DIAGNOSIS — N401 Enlarged prostate with lower urinary tract symptoms: Secondary | ICD-10-CM | POA: Diagnosis not present

## 2020-12-07 DIAGNOSIS — N138 Other obstructive and reflux uropathy: Secondary | ICD-10-CM | POA: Diagnosis not present

## 2020-12-07 LAB — URINALYSIS, COMPLETE
Bilirubin, UA: NEGATIVE
Glucose, UA: NEGATIVE
Ketones, UA: NEGATIVE
Leukocytes,UA: NEGATIVE
Nitrite, UA: NEGATIVE
Protein,UA: NEGATIVE
Specific Gravity, UA: 1.025 (ref 1.005–1.030)
Urobilinogen, Ur: 0.2 mg/dL (ref 0.2–1.0)
pH, UA: 6.5 (ref 5.0–7.5)

## 2020-12-07 LAB — MICROSCOPIC EXAMINATION
Bacteria, UA: NONE SEEN
WBC, UA: NONE SEEN /hpf (ref 0–5)

## 2020-12-07 MED ORDER — CLOMIPHENE CITRATE 50 MG PO TABS: 50.0000 mg | ORAL_TABLET | Freq: Every day | ORAL | 3 refills | Status: DC

## 2020-12-08 LAB — PSA: Prostate Specific Ag, Serum: 1.4 ng/mL (ref 0.0–4.0)

## 2020-12-08 LAB — HEMOGLOBIN AND HEMATOCRIT, BLOOD
Hematocrit: 48.6 % (ref 37.5–51.0)
Hemoglobin: 16.5 g/dL (ref 13.0–17.7)

## 2020-12-08 LAB — TESTOSTERONE: Testosterone: 534 ng/dL (ref 264–916)

## 2021-01-02 ENCOUNTER — Other Ambulatory Visit: Payer: Self-pay | Admitting: Urology

## 2021-01-02 NOTE — Telephone Encounter (Signed)
Ok to fill? Viagra not on current medication list, but Per last note:   4. Erectile dysfunction - SHIM score is worse - Patient admits that he is currently pleased with managing his ED on Viagra 100 mg with salvage 20 mg sildenafil

## 2021-01-09 ENCOUNTER — Other Ambulatory Visit: Payer: Self-pay | Admitting: Urology

## 2021-01-20 DIAGNOSIS — M722 Plantar fascial fibromatosis: Secondary | ICD-10-CM | POA: Diagnosis not present

## 2021-01-20 DIAGNOSIS — M7732 Calcaneal spur, left foot: Secondary | ICD-10-CM | POA: Diagnosis not present

## 2021-01-20 DIAGNOSIS — M216X2 Other acquired deformities of left foot: Secondary | ICD-10-CM | POA: Diagnosis not present

## 2021-01-20 DIAGNOSIS — M9261 Juvenile osteochondrosis of tarsus, right ankle: Secondary | ICD-10-CM | POA: Diagnosis not present

## 2021-01-20 DIAGNOSIS — M7731 Calcaneal spur, right foot: Secondary | ICD-10-CM | POA: Diagnosis not present

## 2021-01-20 DIAGNOSIS — M216X1 Other acquired deformities of right foot: Secondary | ICD-10-CM | POA: Diagnosis not present

## 2021-01-20 DIAGNOSIS — M9262 Juvenile osteochondrosis of tarsus, left ankle: Secondary | ICD-10-CM | POA: Diagnosis not present

## 2021-01-20 DIAGNOSIS — M79672 Pain in left foot: Secondary | ICD-10-CM | POA: Diagnosis not present

## 2021-01-23 NOTE — Patient Instructions (Addendum)
DUE TO COVID-19 ONLY ONE VISITOR IS ALLOWED TO COME WITH YOU AND STAY IN THE WAITING ROOM ONLY DURING PRE OP AND PROCEDURE DAY OF SURGERY. THE 1 VISITOR  MAY VISIT WITH YOU AFTER SURGERY IN YOUR PRIVATE ROOM DURING VISITING HOURS ONLY!  YOU NEED TO HAVE A COVID 19 TEST ON_5/10______ @_9 :45_____, THIS TEST MUST BE DONE BEFORE SURGERY,  COVID TESTING SITE [name] [address], IT IS ON THE RIGHT GOING OUT [address] APPROXIMATELY  2 MINUTES PAST [employer] ON THE RIGHT. ONCE YOUR COVID TEST IS COMPLETED,  PLEASE BEGIN THE QUARANTINE INSTRUCTIONS AS OUTLINED IN YOUR HANDOUT.                Raymond Flowers    Your procedure is scheduled on: 02/02/21   Report to [name] Main  Entrance   Report to Short stay at 5:15 AM     Call this number if you have problems the morning of surgery Raymond Flowers, NO CHEWING GUM Raymond Flowers.  No food after midnight.    You may have clear liquid until 4:30 AM.    At 4:30 AM drink pre surgery drink.   Nothing by mouth after 4:30 AM.    Don't take any medications the morning of surgery                               You may not have any metal on your body .              Do not wear jewelry,  lotions, powders or deodorant                         Men may shave face and neck.   Do not bring valuables to the hospital. Raymond Flowers.  Contacts, dentures or bridgework may not be worn into surgery.      Patients discharged the day of surgery will not be allowed to drive home.   IF YOU ARE HAVING SURGERY AND GOING HOME THE SAME DAY, YOU MUST HAVE AN ADULT TO DRIVE YOU HOME AND BE WITH YOU FOR 24 HOURS.  YOU MAY GO HOME BY TAXI OR UBER OR ORTHERWISE, BUT AN ADULT MUST ACCOMPANY YOU HOME AND STAY WITH YOU FOR 24 HOURS.  Name and phone number of your driver:  Special Instructions: N/A               Please read over the following fact sheets you were given: _____________________________________________________________________  [name]- Preparing for Total Shoulder Arthroplasty    Before surgery, you can play an important role. Because skin is not sterile, your skin needs to be as free of germs as possible. You can reduce the number of germs on your skin by using the following products. . Benzoyl Peroxide Gel o Reduces the number of germs present on the skin o Applied twice a day to shoulder area starting two days before surgery    ==================================================================  Please follow these instructions carefully:  BENZOYL PEROXIDE 5% GEL  Please do not use if you have an allergy to benzoyl peroxide.   If your skin becomes reddened/irritated stop using the benzoyl peroxide.  Starting  two days before surgery, apply as follows: 1. Apply benzoyl peroxide in the morning and at night. Apply after taking a shower. If you are not taking a shower clean entire shoulder front, back, and side along with the armpit with a clean wet washcloth.  2. Place a quarter-sized dollop on your shoulder and rub in thoroughly, making sure to cover the front, back, and side of your shoulder, along with the armpit.   2 days before ____ AM   ____ PM              1 day before ____ AM   ____ PM                         3. Do this twice a day for two days.  (Last application is the night before surgery, AFTER using the CHG soap as described below).  4. Do NOT apply benzoyl peroxide gel on the day of surgery.           Raymond Flowers - Preparing for Surgery Before surgery, you can play an important role.   Because skin is not sterile, your skin needs to be as free of germs as possible.   You can reduce the number of germs on your skin by washing with CHG (chlorahexidine gluconate) soap before surgery.  CHG is an antiseptic cleaner which kills germs and bonds with the skin to  continue killing germs even after washing. Please DO NOT use if you have an allergy to CHG or antibacterial soaps.  If your skin becomes reddened/irritated stop using the CHG and inform your nurse when you arrive at Short Stay.  You may shave your face/neck.  Please follow these instructions carefully:  1.  Shower with CHG Soap the night before surgery and the  morning of Surgery.  2.  If you choose to wash your hair, wash your hair first as usual with your  normal  shampoo.  3.  After you shampoo, rinse your hair and body thoroughly to remove the  shampoo.                                        4.  Use CHG as you would any other liquid soap.  You can apply chg directly  to the skin and wash                       Gently with a scrungie or clean washcloth.  5.  Apply the CHG Soap to your body ONLY FROM THE NECK DOWN.   Do not use on face/ open                           Wound or open sores. Avoid contact with eyes, ears mouth and genitals (private parts).                       Wash face,  Genitals (private parts) with your normal soap.             6.  Wash thoroughly, paying special attention to the area where your surgery  will be performed.  7.  Thoroughly rinse your body with warm water from the neck down.  8.  DO NOT shower/wash with your normal soap after using and rinsing off  the CHG Soap.             9.  Pat yourself dry with a clean towel.            10.  Wear clean pajamas.            11.  Place clean sheets on your bed the night of your first shower and do not  sleep with pets. Day of Surgery : Do not apply any lotions/deodorants the morning of surgery.  Please wear clean clothes to the hospital/surgery center.  FAILURE TO FOLLOW THESE INSTRUCTIONS MAY RESULT IN THE CANCELLATION OF YOUR SURGERY PATIENT SIGNATURE_________________________________  NURSE  SIGNATURE__________________________________  ________________________________________________________________________   Raymond Flowers  An incentive spirometer is a tool that can help keep your lungs clear and active. This tool measures how well you are filling your lungs with each breath. Taking long deep breaths may help reverse or decrease the chance of developing breathing (pulmonary) problems (especially infection) following:  A long period of time when you are unable to move or be active. BEFORE THE PROCEDURE   If the spirometer includes an indicator to show your best effort, your nurse or respiratory therapist will set it to a desired goal.  If possible, sit up straight or lean slightly forward. Try not to slouch.  Hold the incentive spirometer in an upright position. INSTRUCTIONS FOR USE  1. Sit on the edge of your bed if possible, or sit up as far as you can in bed or on a chair. 2. Hold the incentive spirometer in an upright position. 3. Breathe out normally. 4. Place the mouthpiece in your mouth and seal your lips tightly around it. 5. Breathe in slowly and as deeply as possible, raising the piston or the ball toward the top of the column. 6. Hold your breath for 3-5 seconds or for as long as possible. Allow the piston or ball to fall to the bottom of the column. 7. Remove the mouthpiece from your mouth and breathe out normally. 8. Rest for a few seconds and repeat Steps 1 through 7 at least 10 times every 1-2 hours when you are awake. Take your time and take a few normal breaths between deep breaths. 9. The spirometer may include an indicator to show your best effort. Use the indicator as a goal to work toward during each repetition. 10. After each set of 10 deep breaths, practice coughing to be sure your lungs are clear. If you have an incision (the cut made at the time of surgery), support your incision when coughing by placing a pillow or rolled up towels firmly  against it. Once you are able to get out of bed, walk around indoors and cough well. You may stop using the incentive spirometer when instructed by your caregiver.  RISKS AND COMPLICATIONS  Take your time so you do not get dizzy or light-headed.  If you are in pain, you may need to take or ask for pain medication before doing incentive spirometry. It is harder to take a deep breath if you are having pain. AFTER USE  Rest and breathe slowly and easily.  It can be helpful to keep track of a log of your progress. Your caregiver can provide you with a simple table to help with this. If you are using the spirometer at home, follow these instructions: [place] IF:   You are having difficultly using the spirometer.  You have trouble using the spirometer as often as instructed.  Your pain medication is not giving enough relief while using the spirometer.  You develop fever of 100.5 F (38.1 C) or higher. SEEK IMMEDIATE MEDICAL CARE IF:   You cough up bloody sputum that had not been present before.  You develop fever of 102 F (38.9 C) or greater.  You develop worsening pain at or near the incision site. MAKE SURE YOU:   Understand these instructions.  Will watch your condition.  Will get help right away if you are not doing well or get worse. Document Released: 01/21/2007 Document Revised: 12/03/2011 Document Reviewed: 03/24/2007 [facility] Patient Information 2014 [place], [place].   ________________________________________________________________________

## 2021-01-24 ENCOUNTER — Encounter (HOSPITAL_COMMUNITY): Payer: Self-pay

## 2021-01-24 ENCOUNTER — Other Ambulatory Visit: Payer: Self-pay

## 2021-01-24 ENCOUNTER — Encounter (HOSPITAL_COMMUNITY)
Admission: RE | Admit: 2021-01-24 | Discharge: 2021-01-24 | Disposition: A | Discharge status: Home / Self Care | Payer: Medicare HMO | Source: Ambulatory Visit | Attending: Orthopedic Surgery | Admitting: Orthopedic Surgery

## 2021-01-24 DIAGNOSIS — Z01818 Encounter for other preprocedural examination: Secondary | ICD-10-CM | POA: Diagnosis not present

## 2021-01-24 HISTORY — DX: Unspecified osteoarthritis, unspecified site: M19.90

## 2021-01-24 HISTORY — DX: Personal history of urinary calculi: Z87.442

## 2021-01-24 LAB — BASIC METABOLIC PANEL
Anion gap: 8 (ref 5–15)
BUN: 20 mg/dL (ref 8–23)
CO2: 28 mmol/L (ref 22–32)
Calcium: 9.3 mg/dL (ref 8.9–10.3)
Chloride: 104 mmol/L (ref 98–111)
Creatinine, Ser: 1.26 mg/dL — ABNORMAL HIGH (ref 0.61–1.24)
GFR, Estimated: >60 mL/min (ref >60)
Glucose, Bld: 108 mg/dL — ABNORMAL HIGH (ref 70–99)
Potassium: 4 mmol/L (ref 3.5–5.1)
Sodium: 140 mmol/L (ref 135–145)

## 2021-01-24 LAB — CBC
HCT: 45.8 % (ref 39.0–52.0)
Hemoglobin: 16 g/dL (ref 13.0–17.0)
MCH: 33.1 pg (ref 26.0–34.0)
MCHC: 34.9 g/dL (ref 30.0–36.0)
MCV: 94.8 fL (ref 80.0–100.0)
Platelets: 175 10*3/uL (ref 150–400)
RBC: 4.83 MIL/uL (ref 4.22–5.81)
RDW: 13.1 % (ref 11.5–15.5)
WBC: 4.7 10*3/uL (ref 4.0–10.5)
nRBC: 0 % (ref 0.0–0.2)

## 2021-01-24 LAB — SURGICAL PCR SCREEN
MRSA, PCR: NEGATIVE
Staphylococcus aureus: NEGATIVE

## 2021-01-24 NOTE — Progress Notes (Signed)
COVID Vaccine Completed:yes Date COVID Vaccine completed:12/18/19-boosters 06/20/20,01/03/21 COVID vaccine manufacturer: Pfizer    Moderna   [employer]   PCP - Dr. Loleta Chance Cardiologist - none  Chest x-ray - no EKG - 01/24/21-chart, epic Stress Test - no ECHO - no Cardiac Cath - na Pacemaker/ICD device last checked:NA  Sleep Study - no CPAP -   Fasting Blood Sugar - NA Checks Blood Sugar _____ times a day  Blood Thinner Instructions:ASA 81/ Dr. Sabra Heck Aspirin Instructions:Stop 5 days prior to DOS/ Supple Last Dose:01/17/21  Anesthesia review:   Patient denies shortness of breath, fever, cough and chest pain at PAT appointment Yes. Pt has no SOB with any activities  Patient verbalized understanding of instructions that were given to them at the PAT appointment. Patient was also instructed that they will need to review over the PAT instructions again at home before surgery.yes

## 2021-01-31 ENCOUNTER — Other Ambulatory Visit (HOSPITAL_COMMUNITY)
Admission: RE | Admit: 2021-01-31 | Discharge: 2021-01-31 | Disposition: A | Discharge status: Home / Self Care | Payer: Medicare HMO | Source: Ambulatory Visit | Attending: Orthopedic Surgery | Admitting: Orthopedic Surgery

## 2021-01-31 DIAGNOSIS — Z20822 Contact with and (suspected) exposure to covid-19: Secondary | ICD-10-CM | POA: Insufficient documentation

## 2021-01-31 DIAGNOSIS — Z01812 Encounter for preprocedural laboratory examination: Secondary | ICD-10-CM | POA: Diagnosis not present

## 2021-01-31 LAB — SARS CORONAVIRUS 2 (TAT 6-24 HRS): SARS Coronavirus 2: NEGATIVE

## 2021-02-01 NOTE — Anesthesia Preprocedure Evaluation (Addendum)
Anesthesia Evaluation  Patient identified by MRN, date of birth, ID band Patient awake    Reviewed: Allergy & Precautions, NPO status , Patient's Chart, lab work & pertinent test results  Airway Mallampati: I  TM Distance: >3 FB Neck ROM: Full    Dental no notable dental hx.    Pulmonary neg pulmonary ROS,    Pulmonary exam normal breath sounds clear to auscultation       Cardiovascular hypertension, Normal cardiovascular exam Rhythm:Regular Rate:Normal     Neuro/Psych negative neurological ROS  negative psych ROS   GI/Hepatic negative GI ROS,   Endo/Other  Androgen deficiency  Renal/GU Renal disease (kidney stones)  negative genitourinary   Musculoskeletal  (+) Arthritis ,   Abdominal   Peds  Hematology negative hematology ROS (+)   Anesthesia Other Findings   Reproductive/Obstetrics                           Anesthesia Physical Anesthesia Plan  ASA: II  Anesthesia Plan: General and Regional   Post-op Pain Management:  Regional for Post-op pain and GA combined w/ Regional for post-op pain   Induction: Intravenous  PONV Risk Score and Plan: 2  Airway Management Planned: Oral ETT  Additional Equipment: None  Intra-op Plan:   Post-operative Plan: Extubation in OR  Informed Consent: I have reviewed the patients History and Physical, chart, labs and discussed the procedure including the risks, benefits and alternatives for the proposed anesthesia with the patient or authorized representative who has indicated his/her understanding and acceptance.     Dental advisory given  Plan Discussed with: CRNA, Anesthesiologist and Surgeon  Anesthesia Plan Comments:        Anesthesia Quick Evaluation

## 2021-02-02 ENCOUNTER — Ambulatory Visit (HOSPITAL_COMMUNITY): Payer: Medicare HMO | Admitting: Anesthesiology

## 2021-02-02 ENCOUNTER — Encounter (HOSPITAL_COMMUNITY)
Admission: RE | Disposition: A | Discharge status: Home / Self Care | Payer: Self-pay | Source: Ambulatory Visit | Attending: Orthopedic Surgery

## 2021-02-02 ENCOUNTER — Ambulatory Visit (HOSPITAL_COMMUNITY)
Admission: RE | Admit: 2021-02-02 | Discharge: 2021-02-02 | Disposition: A | Discharge status: Home / Self Care | Payer: Medicare HMO | Source: Ambulatory Visit | Attending: Orthopedic Surgery | Admitting: Orthopedic Surgery

## 2021-02-02 ENCOUNTER — Encounter (HOSPITAL_COMMUNITY): Payer: Self-pay | Admitting: Orthopedic Surgery

## 2021-02-02 DIAGNOSIS — Z8249 Family history of ischemic heart disease and other diseases of the circulatory system: Secondary | ICD-10-CM | POA: Diagnosis not present

## 2021-02-02 DIAGNOSIS — Z7982 Long term (current) use of aspirin: Secondary | ICD-10-CM | POA: Insufficient documentation

## 2021-02-02 DIAGNOSIS — M75101 Unspecified rotator cuff tear or rupture of right shoulder, not specified as traumatic: Secondary | ICD-10-CM | POA: Insufficient documentation

## 2021-02-02 DIAGNOSIS — N401 Enlarged prostate with lower urinary tract symptoms: Secondary | ICD-10-CM | POA: Diagnosis not present

## 2021-02-02 DIAGNOSIS — M19011 Primary osteoarthritis, right shoulder: Secondary | ICD-10-CM | POA: Insufficient documentation

## 2021-02-02 DIAGNOSIS — I10 Essential (primary) hypertension: Secondary | ICD-10-CM | POA: Diagnosis not present

## 2021-02-02 DIAGNOSIS — Z841 Family history of disorders of kidney and ureter: Secondary | ICD-10-CM | POA: Insufficient documentation

## 2021-02-02 DIAGNOSIS — M12811 Other specific arthropathies, not elsewhere classified, right shoulder: Secondary | ICD-10-CM | POA: Diagnosis not present

## 2021-02-02 DIAGNOSIS — Z79899 Other long term (current) drug therapy: Secondary | ICD-10-CM | POA: Diagnosis not present

## 2021-02-02 DIAGNOSIS — G8918 Other acute postprocedural pain: Secondary | ICD-10-CM | POA: Diagnosis not present

## 2021-02-02 DIAGNOSIS — N138 Other obstructive and reflux uropathy: Secondary | ICD-10-CM | POA: Diagnosis not present

## 2021-02-02 HISTORY — PX: REVERSE SHOULDER ARTHROPLASTY: SHX5054

## 2021-02-02 SURGERY — ARTHROPLASTY, SHOULDER, TOTAL, REVERSE
Anesthesia: Regional | Site: Shoulder | Laterality: Right

## 2021-02-02 MED ORDER — LACTATED RINGERS IV BOLUS
500.0000 mL | Freq: Once | INTRAVENOUS | Status: AC
  Administered 2021-02-02: 500 mL via INTRAVENOUS

## 2021-02-02 MED ORDER — ROCURONIUM BROMIDE 10 MG/ML (PF) SYRINGE
PREFILLED_SYRINGE | INTRAVENOUS | Status: DC | PRN
  Administered 2021-02-02: 70 mg via INTRAVENOUS

## 2021-02-02 MED ORDER — MIDAZOLAM HCL 2 MG/2ML IJ SOLN
INTRAMUSCULAR | Status: DC | PRN
  Administered 2021-02-02: 2 mg via INTRAVENOUS

## 2021-02-02 MED ORDER — FENTANYL CITRATE (PF) 100 MCG/2ML IJ SOLN
INTRAMUSCULAR | Status: DC | PRN
  Administered 2021-02-02 (×2): 50 ug via INTRAVENOUS

## 2021-02-02 MED ORDER — METOCLOPRAMIDE HCL 5 MG PO TABS
5.0000 mg | ORAL_TABLET | Freq: Three times a day (TID) | ORAL | Status: DC | PRN
  Filled 2021-02-02: qty 2

## 2021-02-02 MED ORDER — ONDANSETRON HCL 4 MG PO TABS: 4.0000 mg | ORAL_TABLET | Freq: Three times a day (TID) | ORAL | 0 refills | Status: DC | PRN

## 2021-02-02 MED ORDER — DEXAMETHASONE SODIUM PHOSPHATE 10 MG/ML IJ SOLN
INTRAMUSCULAR | Status: DC | PRN
  Administered 2021-02-02: 8 mg via INTRAVENOUS

## 2021-02-02 MED ORDER — PROPOFOL 10 MG/ML IV BOLUS
INTRAVENOUS | Status: AC
  Filled 2021-02-02: qty 20

## 2021-02-02 MED ORDER — FENTANYL CITRATE (PF) 100 MCG/2ML IJ SOLN
INTRAMUSCULAR | Status: AC
  Filled 2021-02-02: qty 2

## 2021-02-02 MED ORDER — PROPOFOL 10 MG/ML IV BOLUS
INTRAVENOUS | Status: DC | PRN
  Administered 2021-02-02: 170 mg via INTRAVENOUS

## 2021-02-02 MED ORDER — METOCLOPRAMIDE HCL 5 MG/ML IJ SOLN: 5.0000 mg | Freq: Three times a day (TID) | INTRAMUSCULAR | Status: DC | PRN

## 2021-02-02 MED ORDER — LACTATED RINGERS IV SOLN: INTRAVENOUS | Status: DC

## 2021-02-02 MED ORDER — TRAMADOL HCL 50 MG PO TABS: 50.0000 mg | ORAL_TABLET | Freq: Four times a day (QID) | ORAL | 0 refills | Status: DC | PRN

## 2021-02-02 MED ORDER — ONDANSETRON HCL 4 MG/2ML IJ SOLN
INTRAMUSCULAR | Status: AC
  Filled 2021-02-02: qty 2

## 2021-02-02 MED ORDER — BUPIVACAINE LIPOSOME 1.3 % IJ SUSP
INTRAMUSCULAR | Status: DC | PRN
  Administered 2021-02-02: 10 mL via PERINEURAL

## 2021-02-02 MED ORDER — VANCOMYCIN HCL 1000 MG IV SOLR
INTRAVENOUS | Status: DC | PRN
  Administered 2021-02-02: 1000 mg

## 2021-02-02 MED ORDER — LACTATED RINGERS IV BOLUS
250.0000 mL | Freq: Once | INTRAVENOUS | Status: AC
  Administered 2021-02-02: 250 mL via INTRAVENOUS

## 2021-02-02 MED ORDER — LIDOCAINE 2% (20 MG/ML) 5 ML SYRINGE
INTRAMUSCULAR | Status: DC | PRN
  Administered 2021-02-02: 80 mg via INTRAVENOUS

## 2021-02-02 MED ORDER — PROMETHAZINE HCL 25 MG/ML IJ SOLN: 6.2500 mg | INTRAMUSCULAR | Status: DC | PRN

## 2021-02-02 MED ORDER — MIDAZOLAM HCL 2 MG/2ML IJ SOLN
INTRAMUSCULAR | Status: AC
  Filled 2021-02-02: qty 2

## 2021-02-02 MED ORDER — ONDANSETRON HCL 4 MG/2ML IJ SOLN: 4.0000 mg | Freq: Four times a day (QID) | INTRAMUSCULAR | Status: DC | PRN

## 2021-02-02 MED ORDER — HYDROCODONE-ACETAMINOPHEN 5-325 MG PO TABS: 1.0000 | ORAL_TABLET | ORAL | 0 refills | Status: DC | PRN

## 2021-02-02 MED ORDER — EPHEDRINE SULFATE-NACL 50-0.9 MG/10ML-% IV SOSY
PREFILLED_SYRINGE | INTRAVENOUS | Status: DC | PRN
  Administered 2021-02-02 (×2): 5 mg via INTRAVENOUS

## 2021-02-02 MED ORDER — CYCLOBENZAPRINE HCL 10 MG PO TABS: 10.0000 mg | ORAL_TABLET | Freq: Three times a day (TID) | ORAL | 1 refills | Status: DC | PRN

## 2021-02-02 MED ORDER — EPHEDRINE 5 MG/ML INJ
INTRAVENOUS | Status: AC
  Filled 2021-02-02: qty 10

## 2021-02-02 MED ORDER — LIDOCAINE 2% (20 MG/ML) 5 ML SYRINGE
INTRAMUSCULAR | Status: AC
  Filled 2021-02-02: qty 5

## 2021-02-02 MED ORDER — ONDANSETRON HCL 4 MG PO TABS
4.0000 mg | ORAL_TABLET | Freq: Four times a day (QID) | ORAL | Status: DC | PRN
  Filled 2021-02-02: qty 1

## 2021-02-02 MED ORDER — SUGAMMADEX SODIUM 200 MG/2ML IV SOLN
INTRAVENOUS | Status: DC | PRN
  Administered 2021-02-02: 200 mg via INTRAVENOUS

## 2021-02-02 MED ORDER — SODIUM CHLORIDE 0.9 % IR SOLN
Status: DC | PRN
  Administered 2021-02-02: 1000 mL

## 2021-02-02 MED ORDER — PHENYLEPHRINE 40 MCG/ML (10ML) SYRINGE FOR IV PUSH (FOR BLOOD PRESSURE SUPPORT)
PREFILLED_SYRINGE | INTRAVENOUS | Status: DC | PRN
  Administered 2021-02-02: 120 ug via INTRAVENOUS

## 2021-02-02 MED ORDER — BUPIVACAINE HCL (PF) 0.5 % IJ SOLN
INTRAMUSCULAR | Status: DC | PRN
  Administered 2021-02-02: 20 mL

## 2021-02-02 MED ORDER — ROCURONIUM BROMIDE 10 MG/ML (PF) SYRINGE
PREFILLED_SYRINGE | INTRAVENOUS | Status: AC
  Filled 2021-02-02: qty 10

## 2021-02-02 MED ORDER — CEFAZOLIN SODIUM-DEXTROSE 2-4 GM/100ML-% IV SOLN
2.0000 g | INTRAVENOUS | Status: AC
  Administered 2021-02-02: 2 g via INTRAVENOUS
  Filled 2021-02-02: qty 100

## 2021-02-02 MED ORDER — ORAL CARE MOUTH RINSE: 15.0000 mL | Freq: Once | OROMUCOSAL | Status: AC

## 2021-02-02 MED ORDER — PHENYLEPHRINE HCL-NACL 10-0.9 MG/250ML-% IV SOLN
INTRAVENOUS | Status: DC | PRN
  Administered 2021-02-02: 25 ug/min via INTRAVENOUS

## 2021-02-02 MED ORDER — TRANEXAMIC ACID-NACL 1000-0.7 MG/100ML-% IV SOLN
1000.0000 mg | INTRAVENOUS | Status: AC
  Administered 2021-02-02: 1000 mg via INTRAVENOUS
  Filled 2021-02-02: qty 100

## 2021-02-02 MED ORDER — PHENYLEPHRINE HCL (PRESSORS) 10 MG/ML IV SOLN
INTRAVENOUS | Status: AC
  Filled 2021-02-02: qty 1

## 2021-02-02 MED ORDER — ONDANSETRON HCL 4 MG/2ML IJ SOLN
INTRAMUSCULAR | Status: DC | PRN
  Administered 2021-02-02: 4 mg via INTRAVENOUS

## 2021-02-02 MED ORDER — NAPROXEN 500 MG PO TABS: 500.0000 mg | ORAL_TABLET | Freq: Two times a day (BID) | ORAL | 1 refills | Status: DC

## 2021-02-02 MED ORDER — FENTANYL CITRATE (PF) 100 MCG/2ML IJ SOLN: 25.0000 ug | INTRAMUSCULAR | Status: DC | PRN

## 2021-02-02 MED ORDER — CHLORHEXIDINE GLUCONATE 0.12 % MT SOLN
15.0000 mL | Freq: Once | OROMUCOSAL | Status: AC
  Administered 2021-02-02: 15 mL via OROMUCOSAL

## 2021-02-02 MED ORDER — DEXAMETHASONE SODIUM PHOSPHATE 10 MG/ML IJ SOLN
INTRAMUSCULAR | Status: AC
  Filled 2021-02-02: qty 1

## 2021-02-02 MED ORDER — ACETAMINOPHEN 500 MG PO TABS
1000.0000 mg | ORAL_TABLET | Freq: Once | ORAL | Status: AC
  Administered 2021-02-02: 1000 mg via ORAL
  Filled 2021-02-02: qty 2

## 2021-02-02 MED ORDER — VANCOMYCIN HCL 1000 MG IV SOLR
INTRAVENOUS | Status: AC
  Filled 2021-02-02: qty 1000

## 2021-02-02 MED ORDER — DROPERIDOL 2.5 MG/ML IJ SOLN: 0.6250 mg | Freq: Once | INTRAMUSCULAR | Status: DC | PRN

## 2021-02-02 SURGICAL SUPPLY — 62 items
BAG ZIPLOCK 12X15 (MISCELLANEOUS) ×2 IMPLANT
BLADE SAW SGTL 83.5X18.5 (BLADE) ×2 IMPLANT
COOLER ICEMAN CLASSIC (MISCELLANEOUS) ×2 IMPLANT
COVER BACK TABLE 60X90IN (DRAPES) ×2 IMPLANT
COVER SURGICAL LIGHT HANDLE (MISCELLANEOUS) ×2 IMPLANT
COVER WAND RF STERILE (DRAPES) IMPLANT
CUP SUT UNIV REVERS 42 NEUT (Shoulder) ×2 IMPLANT
DERMABOND ADVANCED (GAUZE/BANDAGES/DRESSINGS) ×1
DERMABOND ADVANCED .7 DNX12 (GAUZE/BANDAGES/DRESSINGS) ×1 IMPLANT
DRAPE INCISE IOBAN 66X45 STRL (DRAPES) IMPLANT
DRAPE ORTHO SPLIT 77X108 STRL (DRAPES) ×4
DRAPE SHEET LG 3/4 BI-LAMINATE (DRAPES) ×2 IMPLANT
DRAPE SURG 17X11 SM STRL (DRAPES) ×2 IMPLANT
DRAPE SURG ORHT 6 SPLT 77X108 (DRAPES) ×2 IMPLANT
DRAPE U-SHAPE 47X51 STRL (DRAPES) ×2 IMPLANT
DRESSING AQUACEL AG SP 3.5X6 (GAUZE/BANDAGES/DRESSINGS) ×1 IMPLANT
DRSG AQUACEL AG ADV 3.5X10 (GAUZE/BANDAGES/DRESSINGS) ×2 IMPLANT
DRSG AQUACEL AG SP 3.5X6 (GAUZE/BANDAGES/DRESSINGS) ×2
DURAPREP 26ML APPLICATOR (WOUND CARE) ×2 IMPLANT
ELECT BLADE TIP CTD 4 INCH (ELECTRODE) ×2 IMPLANT
ELECT REM PT RETURN 15FT ADLT (MISCELLANEOUS) ×2 IMPLANT
FACESHIELD WRAPAROUND (MASK) ×10 IMPLANT
GLENOID SYS 42 +4 LAT/24 SHLDR (Miscellaneous) ×2 IMPLANT
GLENOID UNI REV MOD 24 +2 LAT (Joint) ×2 IMPLANT
GLOVE SRG 8 PF TXTR STRL LF DI (GLOVE) ×1 IMPLANT
GLOVE SURG ENC MOIS LTX SZ7 (GLOVE) ×2 IMPLANT
GLOVE SURG ENC MOIS LTX SZ7.5 (GLOVE) ×2 IMPLANT
GLOVE SURG UNDER POLY LF SZ7 (GLOVE) ×2 IMPLANT
GLOVE SURG UNDER POLY LF SZ8 (GLOVE) ×2
GOWN STRL REUS W/TWL LRG LVL3 (GOWN DISPOSABLE) ×4 IMPLANT
INSERT HUMERAL LRG 42/+6 (Miscellaneous) ×2 IMPLANT
KIT BASIN OR (CUSTOM PROCEDURE TRAY) ×2 IMPLANT
KIT TURNOVER KIT A (KITS) ×2 IMPLANT
MANIFOLD NEPTUNE II (INSTRUMENTS) ×2 IMPLANT
NEEDLE TAPERED W/ NITINOL LOOP (MISCELLANEOUS) ×2 IMPLANT
NS IRRIG 1000ML POUR BTL (IV SOLUTION) ×2 IMPLANT
PACK SHOULDER (CUSTOM PROCEDURE TRAY) ×2 IMPLANT
PAD ARMBOARD 7.5X6 YLW CONV (MISCELLANEOUS) ×2 IMPLANT
PAD COLD SHLDR WRAP-ON (PAD) ×2 IMPLANT
PIN NITINOL TARGETER 2.8 (PIN) IMPLANT
PIN SET MODULAR GLENOID SYSTEM (PIN) ×2 IMPLANT
RESTRAINT HEAD UNIVERSAL NS (MISCELLANEOUS) ×2 IMPLANT
SCREW CENTRAL MOD 30MM (Screw) ×2 IMPLANT
SCREW PERI LOCK 5.5X36 (Screw) ×4 IMPLANT
SCREW PERIPHERAL 5.5X20 LOCK (Screw) ×4 IMPLANT
SLING ARM FOAM STRAP LRG (SOFTGOODS) ×2 IMPLANT
SLING ARM FOAM STRAP MED (SOFTGOODS) IMPLANT
SLING ARM FOAM STRAP XLG (SOFTGOODS) ×2 IMPLANT
SPONGE LAP 18X18 RF (DISPOSABLE) IMPLANT
STEM HUMERAL UNI REVERSE SZ10 (Stem) ×2 IMPLANT
SUCTION FRAZIER HANDLE 12FR (TUBING) ×2
SUCTION TUBE FRAZIER 12FR DISP (TUBING) ×1 IMPLANT
SUT FIBERWIRE #2 38 T-5 BLUE (SUTURE) ×4
SUT MNCRL AB 3-0 PS2 18 (SUTURE) ×2 IMPLANT
SUT MON AB 2-0 CT1 36 (SUTURE) ×2 IMPLANT
SUT VIC AB 1 CT1 36 (SUTURE) ×2 IMPLANT
SUTURE FIBERWR #2 38 T-5 BLUE (SUTURE) ×2 IMPLANT
SUTURE TAPE 1.3 40 TPR END (SUTURE) ×2 IMPLANT
SUTURETAPE 1.3 40 TPR END (SUTURE) ×4
TOWEL OR 17X26 10 PK STRL BLUE (TOWEL DISPOSABLE) ×2 IMPLANT
TOWEL OR NON WOVEN STRL DISP B (DISPOSABLE) ×2 IMPLANT
WATER STERILE IRR 1000ML POUR (IV SOLUTION) ×4 IMPLANT

## 2021-02-02 NOTE — H&P (Signed)
Raymond Flowers    Chief Complaint: Right shoulder rotator cuff tear arthropathy HPI: The patient is a 65 y.o. male with chronic and progressively increasing right shoulder pain related to severe rotator cuff tear arthropathy.  Due to his increasing functional imitations and failure to respond to prolonged attempts at conservative management he is brought to the operating room at this time for planned right shoulder reverse arthroplasty  Past Medical History:  Diagnosis Date  . Androgen deficiency   . Arthritis    shoulders, toe  . Benign enlargement of prostate   . Blood in semen   . BPH (benign prostatic hyperplasia)   . Erectile dysfunction   . History of kidney stones   . Hypertension   . Impotence   . Skin cancer, basal cell    Resected from Right facial area.     Past Surgical History:  Procedure Laterality Date  . KNEE ARTHROSCOPY Right 2016  . SHOULDER SURGERY  2009   right  shoulder x 2  (20016)  . TONSILLECTOMY    . tricep surgery Left 2015  . VARICOCELE EXCISION      Family History  Problem Relation Age of Onset  . Heart disease Mother 11  . Hypertension Mother   . Heart disease Father 65       MI   . Kidney Stones Father        mother  . Hypertension Brother   . Prostate cancer Neg Hx   . Kidney disease Neg Hx   . Kidney cancer Neg Hx   . Bladder Cancer Neg Hx     Social History:  reports that he has never smoked. He has never used smokeless tobacco. He reports current alcohol use of about 2.0 standard drinks of alcohol per week. He reports that he does not use drugs.   Medications Prior to Admission  Medication Sig Dispense Refill  . aspirin 81 MG tablet Take 81 mg by mouth daily.    . clomiPHENE (CLOMID) 50 MG tablet Take 1 tablet (50 mg total) by mouth daily. 90 tablet 3  . hydrochlorothiazide (HYDRODIURIL) 25 MG tablet Take 25 mg by mouth daily.   0  . Multiple Vitamins-Minerals (OCUVITE ADULT 50+ PO) Take 1 tablet by mouth daily.    .  naproxen sodium (ALEVE) 220 MG tablet Take 220-440 mg by mouth daily as needed (pain).    . vitamin B-12 (CYANOCOBALAMIN) 1000 MCG tablet Take 1,000 mcg by mouth daily.    . sildenafil (VIAGRA) 100 MG tablet TAKE ONE TABLET BY MOUTH DAILY AS NEEDED FOR ERECTILE DYSFUNCTION - TAKE TWO HOURS PRIOR TO INTERCOURSE ON AN EMPTY STOMACH (Patient taking differently: Take 100 mg by mouth as needed for erectile dysfunction.) 30 tablet 3     Physical Exam: Right shoulder demonstrates good motion but severe pain with overhead elevation as noted at his recent office visit.  He has achieved almost 150 degrees of active elevation but this is extremely painful for him.  His overall strength is globally decreased secondary to pain and guarding.  Plain radiographs confirm advanced arthritis.  Previous MRI scan confirms a large and chronically retracted tear of the rotator cuff  Vitals  Temp:  [98.9 F (37.2 C)] 98.9 F (37.2 C) (05/12 0555) Pulse Rate:  [69] 69 (05/12 0555) Resp:  [15] 15 (05/12 0555) BP: (154)/(80) 154/80 (05/12 0555) SpO2:  [97 %] 97 % (05/12 0555) Weight:  [90.2 kg] 90.2 kg (05/12 0536)  Assessment/Plan  Impression:  Right shoulder rotator cuff tear arthropathy  Plan of Action: Procedure(s): REVERSE SHOULDER ARTHROPLASTY  Geffrey Michaelsen M Chanley Mcenery 02/02/2021, 6:15 AM Contact # [phone]

## 2021-02-02 NOTE — Anesthesia Procedure Notes (Signed)
Procedure Name: Intubation Date/Time: 02/02/2021 7:39 AM Performed by: Niel Hummer, CRNA Pre-anesthesia Checklist: Patient identified, Emergency Drugs available, Suction available and Patient being monitored Patient Re-evaluated:Patient Re-evaluated prior to induction Oxygen Delivery Method: Circle system utilized Preoxygenation: Pre-oxygenation with 100% oxygen Induction Type: IV induction Ventilation: Mask ventilation without difficulty Laryngoscope Size: Mac and 4 Grade View: Grade II Tube type: Oral Tube size: 7.5 mm Number of attempts: 1 Airway Equipment and Method: Stylet Placement Confirmation: ETT inserted through vocal cords under direct vision,  positive ETCO2 and breath sounds checked- equal and bilateral Secured at: 23 cm Tube secured with: Tape Dental Injury: Teeth and Oropharynx as per pre-operative assessment

## 2021-02-02 NOTE — Discharge Instructions (Signed)
 Kevin M. Supple, M.D., F.A.A.O.S. Orthopaedic Surgery Specializing in Arthroscopic and Reconstructive Surgery of the Shoulder [phone] [address] - [place], [place] [address] - Fax [phone]   POST-OP TOTAL SHOULDER REPLACEMENT INSTRUCTIONS  1. Follow up in the office for your first post-op appointment 10-14 days from the date of your surgery. If you do not already have a scheduled appointment, our office will contact you to schedule.  2. The bandage over your incision is waterproof. You may begin showering with this dressing on. You may leave this dressing on until first follow up appointment within 2 weeks. We prefer you leave this dressing in place until follow up however after 5-7 days if you are having itching or skin irritation and would like to remove it you may do so. Go slow and tug at the borders gently to break the bond the dressing has with the skin. At this point if there is no drainage it is okay to go without a bandage or you may cover it with a light guaze and tape. You can also expect significant bruising around your shoulder that will drift down your arm and into your chest wall. This is very normal and should resolve over several days.   3. Wear your sling/immobilizer at all times except to perform the exercises below or to occasionally let your arm dangle by your side to stretch your elbow. You also need to sleep in your sling immobilizer until instructed otherwise. It is ok to remove your sling if you are sitting in a controlled environment and allow your arm to rest in a position of comfort by your side or on your lap with pillows to give your neck and skin a break from the sling. You may remove it to allow arm to dangle by side to shower. If you are up walking around and when you go to sleep at night you need to wear it.  4. Range of motion to your elbow, wrist, and hand are encouraged 3-5 times daily. Exercise to your hand and fingers helps to reduce  swelling you may experience.   5. Prescriptions for a pain medication and a muscle relaxant are provided for you. It is recommended that if you are experiencing pain that you pain medication alone is not controlling, add the muscle relaxant along with the pain medication which can give additional pain relief. The first 1-2 days is generally the most severe of your pain and then should gradually decrease. As your pain lessens it is recommended that you decrease your use of the pain medications to an "as needed basis'" only and to always comply with the recommended dosages of the pain medications.  6. Pain medications can produce constipation along with their use. If you experience this, the use of an over the counter stool softener or laxative daily is recommended.   7. For additional questions or concerns, please do not hesitate to call the office. If after hours there is an answering service to forward your concerns to the physician on call.  8.Pain control following an exparel block  To help control your post-operative pain you received a nerve block  performed with Exparel which is a long acting anesthetic (numbing agent) which can provide pain relief and sensations of numbness (and relief of pain) in the operative shoulder and arm for up to 3 days. Sometimes it provides mixed relief, meaning you may still have numbness in certain areas of the arm but can still be able to   move  parts of that arm, hand, and fingers. We recommend that your prescribed pain medications  be used as needed. We do not feel it is necessary to "pre medicate" and "stay ahead" of pain.  Taking narcotic pain medications when you are not having any pain can lead to unnecessary and potentially dangerous side effects.    9. Use the ice machine as much as possible in the first 5-7 days from surgery, then you can wean its use to as needed. The ice typically needs to be replaced every 6 hours, instead of ice you can actually freeze  water bottles to put in the cooler and then fill water around them to avoid having to purchase ice. You can have spare water bottles freezing to allow you to rotate them once they have melted. Try to have a thin shirt or light cloth or towel under the ice wrap to protect your skin.   FOR ADDITIONAL INFO ON ICE MACHINE AND INSTRUCTIONS GO TO THE WEBSITE AT  https://[email or web address].com/products/donjoy/[email or web address]-iceman-[email or web address]  10.  We recommend that you avoid any dental work or cleaning in the first 3 months following your joint replacement. This is to help minimize the possibility of infection from the bacteria in your mouth that enters your bloodstream during dental work. We also recommend that you take an antibiotic prior to your dental work for the first year after your shoulder replacement to further help reduce that risk. Please simply contact our office for antibiotics to be sent to your pharmacy prior to dental work.  11. Dental Antibiotics:  In most cases prophylactic antibiotics for Dental procdeures after total joint surgery are not necessary.  Exceptions are as follows:  1. History of prior total joint infection  2. Severely immunocompromised (Organ Transplant, cancer chemotherapy, Rheumatoid biologic meds such as Humera)  3. Poorly controlled diabetes (A1C &gt; 8.0, blood glucose over 200)  If you have one of these conditions, contact your surgeon for an antibiotic prescription, prior to your dental procedure.   POST-OP EXERCISES  Pendulum Exercises  Perform pendulum exercises while standing and bending at the waist. Support your uninvolved arm on a table or chair and allow your operated arm to hang freely. Make sure to do these exercises passively - not using you shoulder muscles. These exercises can be performed once your nerve block effects have worn off.  Repeat 20 times. Do 3 sessions per day.     

## 2021-02-02 NOTE — Transfer of Care (Signed)
Immediate Anesthesia Transfer of Care Note  Patient: Raymond Flowers  Procedure(s) Performed: REVERSE SHOULDER ARTHROPLASTY (Right Shoulder)  Patient Location: PACU  Anesthesia Type:General  Level of Consciousness: awake, alert  and oriented  Airway & Oxygen Therapy: Patient Spontanous Breathing and Patient connected to face mask oxygen  Post-op Assessment: Report given to RN and Post -op Vital signs reviewed and stable  Post vital signs: Reviewed and stable  Last Vitals:  Vitals Value Taken Time  BP    Temp    Pulse    Resp    SpO2      Last Pain:  Vitals:   02/02/21 0555  TempSrc: Oral  PainSc:       Patients Stated Pain Goal: 4 (09/81/19 1478)  Complications: No complications documented.

## 2021-02-02 NOTE — Anesthesia Procedure Notes (Signed)
Anesthesia Regional Block: Interscalene brachial plexus block   Pre-Anesthetic Checklist: ,, timeout performed, Correct Patient, Correct Site, Correct Laterality, Correct Procedure, Correct Position, site marked, Risks and benefits discussed,  Surgical consent,  Pre-op evaluation,  At surgeon's request and post-op pain management  Laterality: Right  Prep: chloraprep       Needles:  Injection technique: Single-shot  Needle Type: Echogenic Stimulator Needle     Needle Length: 9cm  Needle Gauge: 21     Additional Needles:   Procedures:,,,, ultrasound used (permanent image in chart),,,,  Narrative:  Start time: 02/02/2021 6:50 AM End time: 02/02/2021 6:55 AM Injection made incrementally with aspirations every 5 mL.  Performed by: Personally  Anesthesiologist: Duane Boston, MD  Additional Notes: Functioning IV was confirmed and monitors applied.  A 62mm 22ga echogenic arrow stimulator was used. Sterile prep and drape,hand hygiene and sterile gloves were used.Ultrasound guidance: relevant anatomy identified, needle position confirmed, local anesthetic spread visualized around nerve(s)., vascular puncture avoided.  Image printed for medical record.  Negative aspiration and negative test dose prior to incremental administration of local anesthetic. The patient tolerated the procedure well.

## 2021-02-02 NOTE — Evaluation (Signed)
Occupational Therapy Evaluation Patient Details Name: Raymond Flowers MRN: [id number] DOB: 26-Aug-1956 Today's Date: 02/02/2021    History of Present Illness Patient is a 65 year old male s/p R reverse total shoulder arthroplasty   Clinical Impression   Patient is a 65 year old male s/p shoulder replacement without functional use of right dominant upper extremity secondary to effects of surgery and interscalene block and shoulder precautions. Therapist provided education and instruction to patient and spouse in regards to exercises, precautions, positioning, donning upper extremity clothing and bathing while maintaining shoulder precautions, ice and edema management and donning/doffing sling. Patient and spouse verbalized understanding and demonstrated as needed. Patient needed assistance to donn shirt and provided with instruction on compensatory strategies to perform ADLs. Patient to follow up with MD for further therapy needs.      Follow Up Recommendations  Follow surgeon's recommendation for DC plan and follow-up therapies    Equipment Recommendations  None recommended by OT       Precautions / Restrictions Precautions Precautions: Shoulder Type of Shoulder Precautions: If sitting in controlled environment, ok to come out of sling to give neck a break. Please sleep in it to protect until follow up in office.     OK to use operative arm for feeding, hygiene and ADLs. Ok to instruct Pendulums and lap slides as exercises. Ok to use operative arm within the following parameters for ADL purposes  New ROM Ok for PROM, AAROM, AROM within pain tolerance and within the following ROM ER 20 ABD 45 FE 60. AROM elbow, wrist, hand ok Shoulder Interventions: Shoulder sling/immobilizer;Off for dressing/bathing/exercises Precaution Booklet Issued: Yes (comment) Required Braces or Orthoses: Sling Restrictions Weight Bearing Restrictions: Yes RUE Weight Bearing: Non weight bearing      Mobility  Bed Mobility               General bed mobility comments: in chair    Transfers Overall transfer level: Independent                    Balance Overall balance assessment: Independent                                         ADL either performed or assessed with clinical judgement   ADL Overall ADL's : Needs assistance/impaired Eating/Feeding: Independent   Grooming: Independent   Upper Body Bathing: Minimal assistance;Standing   Lower Body Bathing: Independent   Upper Body Dressing : Minimal assistance;Standing;Cueing for compensatory techniques Upper Body Dressing Details (indicate cue type and reason): d/t nerve block Lower Body Dressing: Independent   Toilet Transfer: Independent   Toileting- Clothing Manipulation and Hygiene: Independent       Functional mobility during ADLs: Independent General ADL Comments: patient and spouse educated in compensatory strategies in order to perform ADL tasks while maintaining precautions                  Pertinent Vitals/Pain Pain Assessment: Faces Faces Pain Scale: Hurts a little bit Pain Location: R shoulder Pain Descriptors / Indicators: Throbbing;Numbness;Heaviness Pain Intervention(s): Monitored during session     Hand Dominance Right   Extremity/Trunk Assessment Upper Extremity Assessment Upper Extremity Assessment: RUE deficits/detail RUE Deficits / Details: + nerve block RUE: Unable to fully assess due to immobilization   Lower Extremity Assessment Lower Extremity Assessment: Overall WFL for tasks assessed   Cervical / Trunk  Assessment Cervical / Trunk Assessment: Normal   Communication Communication Communication: No difficulties   Cognition Arousal/Alertness: Awake/alert Behavior During Therapy: WFL for tasks assessed/performed Overall Cognitive Status: Within Functional Limits for tasks assessed                                        Exercises  Exercises: Shoulder   Shoulder Instructions Shoulder Instructions Donning/doffing shirt without moving shoulder: Minimal assistance;Caregiver independent with task;Patient able to independently direct caregiver Method for sponge bathing under operated UE: Minimal assistance;Patient able to independently direct caregiver;Caregiver independent with task Donning/doffing sling/immobilizer: Caregiver independent with task;Patient able to independently direct caregiver Correct positioning of sling/immobilizer: Caregiver independent with task;Patient able to independently direct caregiver Pendulum exercises (written home exercise program): Caregiver independent with task;Patient able to independently direct caregiver ROM for elbow, wrist and digits of operated UE: Caregiver independent with task;Patient able to independently direct caregiver Sling wearing schedule (on at all times/off for ADL's): Caregiver independent with task;Patient able to independently direct caregiver Proper positioning of operated UE when showering: Patient able to independently direct caregiver;Caregiver independent with task Dressing change:  (N/A) Positioning of UE while sleeping: Caregiver independent with task;Patient able to independently direct caregiver    Home Living Family/patient expects to be discharged to:: Private residence Living Arrangements: Spouse/significant other Available Help at Discharge: Family Type of Home: House Home Access: Stairs to enter Technical brewer of Steps: 1   Home Layout: One level     Bathroom Shower/Tub: Occupational psychologist: Handicapped height     Home Equipment: Shower seat - built in;Hand held shower head          Prior Functioning/Environment Level of Independence: Independent                 OT Problem List: Pain;Impaired UE functional use;Decreased knowledge of precautions         OT Goals(Current goals can be found in the care plan  section) Acute Rehab OT Goals Patient Stated Goal: get back to fishing OT Goal Formulation: All assessment and education complete, DC therapy   AM-PAC OT "6 Clicks" Daily Activity     Outcome Measure Help from another person eating meals?: None Help from another person taking care of personal grooming?: None Help from another person toileting, which includes using toliet, bedpan, or urinal?: None Help from another person bathing (including washing, rinsing, drying)?: A Little Help from another person to put on and taking off regular upper body clothing?: A Little Help from another person to put on and taking off regular lower body clothing?: None 6 Click Score: 22   End of Session Equipment Utilized During Treatment: Other (comment) (sling) Nurse Communication: Other (comment) (OT complete)  Activity Tolerance: Patient tolerated treatment well Patient left: in chair;with call bell/phone within reach;with family/visitor present  OT Visit Diagnosis: Pain Pain - Right/Left: Right Pain - part of body: Shoulder                Time: 6283-1517 OT Time Calculation (min): 40 min Charges:  OT General Charges $OT Visit: 1 Visit OT Evaluation $OT Eval Low Complexity: 1 Low OT Treatments $Self Care/Home Management : 23-37 mins  Delbert Phenix OT OT pager: [place] 02/02/2021, 12:51 PM

## 2021-02-02 NOTE — Op Note (Signed)
02/02/2021  9:28 AM  PATIENT:   Raymond Flowers  65 y.o. male  PRE-OPERATIVE DIAGNOSIS:  Right shoulder rotator cuff tear arthropathy  POST-OPERATIVE DIAGNOSIS: Same  PROCEDURE: Right shoulder reverse arthroplasty utilizing a press-fit size 10 Arthrex stem with a neutral metaphysis, +6 polyethylene insert, 42/+4 glenosphere on a small/+2 baseplate  SURGEON:  Zidan Helget, Metta Clines M.D.  ASSISTANTS: Jenetta Loges, PA-C  ANESTHESIA:   General endotracheal and interscalene block with Exparel  EBL: 200 cc  SPECIMEN: None  Drains: None   PATIENT DISPOSITION:  PACU - hemodynamically stable.    PLAN OF CARE: Discharge to home after PACU  Brief history:  [name] is a 65 year old gentleman who has had chronic and progressively increasing right shoulder pain related to severe osteoarthritis as well as known chronic rotator cuff tearing with progressive rotator cuff tear arthropathy.  Due to his increasing functional limitations and failure to respond to prolonged attempts at conservative management he is brought to the operating this time for planned right shoulder reverse arthroplasty.  Preoperatively, I counseled the patient regarding treatment options and risks versus benefits thereof.  Possible surgical complications were all reviewed including potential for bleeding, infection, neurovascular injury, persistent pain, loss of motion, anesthetic complication, failure of the implant, and possible need for additional surgery. They understand and accept and agrees with our planned procedure.   Procedure in detail:  After undergoing routine preop evaluation the patient received prophylactic antibiotics and interscalene block with Exparel rel was established in the holding area by the anesthesia department.  Patient was subsequently placed supine on the operating table and underwent the smooth induction of a general endotracheal anesthesia.  Placed into the beachchair position and  appropriately padded and protected.  The right shoulder girdle region was sterilely prepped and draped in standard fashion.  Timeout was called.  A deltopectoral approach to the right shoulder was made through a 10 cm incision.  Skin flaps were elevated dissection carried deeply and the deltopectoral interval was developed from proximal to distal with the vein taken laterally.  Of note multiple varicosities traversing the deltopectoral interval were ligated as part of the dissection.  The upper centimeter and a half of the pectoralis major tendon was tenotomized for exposure and adhesions were divided from beneath the deltoid.  The long head biceps tendon was then tenodesed at the upper border of the pectoralis major tendon and the proximal segment was then unroofed and excised.  The remnant of the rotator cuff superiorly was split from the apex of the bicipital groove to the base of the coracoid and the subscap was then separated from the lesser tuberosity using a peel technique and the free margin was then tagged with  Grasping suture tape sutures.  Capsular attachments were then divided from the anterior and inferior margins of the humeral neck allowing deliver the humeral head through the wound.  An extra medullary guide was then used to outline our proposed humeral head resection which was performed with an oscillating saw at approximately 20 degrees retroversion.  The metaphysis was sized at a large and a metal cap was then placed over the cut proximal humeral surface we then exposed the glenoid with appropriate retractors.  A circumferential labral resection was then completed gaining complete visualization of the glenoid.  A guidepin was then directed into the center of the glenoid and approximately 10 degree inferior tilt of the glenoid was then reamed with the central followed by the peripheral reamers and all debris was  then carefully removed.  Glenoid preparation completed with the central drill and  tapped for a 30 mm lag screw.  Our baseplate was then assembled and inserted with vancomycin powder spread liberally on the lag screw threads and excellent purchase and fixation was achieved.  The peripheral locking screws were all then placed using standard technique with excellent purchase and fixation.  A 42/+4 glenosphere was then impacted onto the baseplate and the central locking screw was then placed.  At this point we returned our attention back to the proximal humerus where the canal was opened and at this point we were able to retrieve some previously placed suture anchors from a prior rotator cuff repair.  All anchors and suture material was carefully removed.  The canal was then broached up to a size 10 at approximately 20 degrees retroversion.  A neutral metaphyseal reamer was then utilized preparing the metaphysis.  Trial implant was then placed and trial reduction showed good motion good stability and good soft tissue balance.  Our trial was then removed and our final implant size 10 was assembled on the back table.  Vancomycin powder was then spread liberally into the humeral canal and the implant was then terminally seated with excellent fit and fixation.  This point trial reductions were again performed and ultimately felt that a +6 polygave [place] the best soft tissue balance with good motion good stability.  The trial polywas removed and the final polywas then impacted after the implant was cleaned and dried.  Final reduction showed excellent motion good stability good soft tissue balance all much to our satisfaction.  This time we then mobilized the subscapularis and it showed good elasticity and the subscap was then repaired back to the eyelets on the collar the implant using our previously placed suture tape sutures.  Final irrigation was then completed.  Hemostasis was obtained.  The balance of the vancomycin powder was then spread liberally throughout the soft tissue planes.  The deltopectoral  interval was reapproximated with a series of figure-of-eight number Vicryl sutures.  2-0 Monocryl used to the subcu layer and intracuticular 3-0 Monocryl for the skin followed by Dermabond and Aquacel dressing.  The right arm was then placed into a sling.  The patient was awakened, extubated, and taken to the recovery room in stable condition.  Jenetta Loges, PA-C was utilized as an Environmental consultant throughout this case, essential for help with positioning the patient, positioning extremity, tissue manipulation, implantation of the prosthesis, suture management, wound closure, and intraoperative decision-making.  Marin Shutter MD   Contact # [phone]

## 2021-02-02 NOTE — Anesthesia Postprocedure Evaluation (Signed)
Anesthesia Post Note  Patient: Raymond Flowers  Procedure(s) Performed: REVERSE SHOULDER ARTHROPLASTY (Right Shoulder)     Patient location during evaluation: PACU Anesthesia Type: Regional and General Level of consciousness: awake and alert Pain management: pain level controlled Vital Signs Assessment: post-procedure vital signs reviewed and stable Respiratory status: spontaneous breathing, nonlabored ventilation and respiratory function stable Cardiovascular status: blood pressure returned to baseline and stable Postop Assessment: no apparent nausea or vomiting Anesthetic complications: no   No complications documented.  Last Vitals:  Vitals:   02/02/21 1015 02/02/21 1031  BP: 124/76 (!) 143/82  Pulse: (!) 53 (!) 57  Resp: 17 20  Temp: 36.5 C 36.7 C  SpO2: 98% 95%    Last Pain:  Vitals:   02/02/21 1031  TempSrc: Oral  PainSc:                  Merlinda Frederick

## 2021-02-06 ENCOUNTER — Encounter (HOSPITAL_COMMUNITY): Payer: Self-pay | Admitting: Orthopedic Surgery

## 2021-02-13 DIAGNOSIS — Z96611 Presence of right artificial shoulder joint: Secondary | ICD-10-CM | POA: Diagnosis not present

## 2021-02-13 DIAGNOSIS — Z471 Aftercare following joint replacement surgery: Secondary | ICD-10-CM | POA: Diagnosis not present

## 2021-03-02 DIAGNOSIS — M25511 Pain in right shoulder: Secondary | ICD-10-CM | POA: Diagnosis not present

## 2021-03-02 DIAGNOSIS — Z96611 Presence of right artificial shoulder joint: Secondary | ICD-10-CM | POA: Diagnosis not present

## 2021-03-06 DIAGNOSIS — M25511 Pain in right shoulder: Secondary | ICD-10-CM | POA: Diagnosis not present

## 2021-03-06 DIAGNOSIS — Z96611 Presence of right artificial shoulder joint: Secondary | ICD-10-CM | POA: Diagnosis not present

## 2021-03-09 DIAGNOSIS — Z96611 Presence of right artificial shoulder joint: Secondary | ICD-10-CM | POA: Diagnosis not present

## 2021-03-09 DIAGNOSIS — M25511 Pain in right shoulder: Secondary | ICD-10-CM | POA: Diagnosis not present

## 2021-03-20 DIAGNOSIS — M25511 Pain in right shoulder: Secondary | ICD-10-CM | POA: Diagnosis not present

## 2021-03-20 DIAGNOSIS — Z96611 Presence of right artificial shoulder joint: Secondary | ICD-10-CM | POA: Diagnosis not present

## 2021-03-23 DIAGNOSIS — M25511 Pain in right shoulder: Secondary | ICD-10-CM | POA: Diagnosis not present

## 2021-03-23 DIAGNOSIS — Z96611 Presence of right artificial shoulder joint: Secondary | ICD-10-CM | POA: Diagnosis not present

## 2021-03-28 DIAGNOSIS — M25511 Pain in right shoulder: Secondary | ICD-10-CM | POA: Diagnosis not present

## 2021-03-28 DIAGNOSIS — Z96611 Presence of right artificial shoulder joint: Secondary | ICD-10-CM | POA: Diagnosis not present

## 2021-03-30 DIAGNOSIS — Z96611 Presence of right artificial shoulder joint: Secondary | ICD-10-CM | POA: Diagnosis not present

## 2021-03-30 DIAGNOSIS — M25511 Pain in right shoulder: Secondary | ICD-10-CM | POA: Diagnosis not present

## 2021-03-31 DIAGNOSIS — Z125 Encounter for screening for malignant neoplasm of prostate: Secondary | ICD-10-CM | POA: Diagnosis not present

## 2021-03-31 DIAGNOSIS — Z0000 Encounter for general adult medical examination without abnormal findings: Secondary | ICD-10-CM | POA: Diagnosis not present

## 2021-04-03 DIAGNOSIS — M25511 Pain in right shoulder: Secondary | ICD-10-CM | POA: Diagnosis not present

## 2021-04-03 DIAGNOSIS — Z96611 Presence of right artificial shoulder joint: Secondary | ICD-10-CM | POA: Diagnosis not present

## 2021-04-06 DIAGNOSIS — L570 Actinic keratosis: Secondary | ICD-10-CM | POA: Diagnosis not present

## 2021-04-06 DIAGNOSIS — Z859 Personal history of malignant neoplasm, unspecified: Secondary | ICD-10-CM | POA: Diagnosis not present

## 2021-04-06 DIAGNOSIS — Z85820 Personal history of malignant melanoma of skin: Secondary | ICD-10-CM | POA: Diagnosis not present

## 2021-04-06 DIAGNOSIS — Z872 Personal history of diseases of the skin and subcutaneous tissue: Secondary | ICD-10-CM | POA: Diagnosis not present

## 2021-04-06 DIAGNOSIS — L578 Other skin changes due to chronic exposure to nonionizing radiation: Secondary | ICD-10-CM | POA: Diagnosis not present

## 2021-04-06 DIAGNOSIS — Z85828 Personal history of other malignant neoplasm of skin: Secondary | ICD-10-CM | POA: Diagnosis not present

## 2021-04-07 DIAGNOSIS — Z125 Encounter for screening for malignant neoplasm of prostate: Secondary | ICD-10-CM | POA: Diagnosis not present

## 2021-04-07 DIAGNOSIS — Z96611 Presence of right artificial shoulder joint: Secondary | ICD-10-CM | POA: Diagnosis not present

## 2021-04-07 DIAGNOSIS — M25511 Pain in right shoulder: Secondary | ICD-10-CM | POA: Diagnosis not present

## 2021-04-07 DIAGNOSIS — Z1389 Encounter for screening for other disorder: Secondary | ICD-10-CM | POA: Diagnosis not present

## 2021-04-07 DIAGNOSIS — E538 Deficiency of other specified B group vitamins: Secondary | ICD-10-CM | POA: Diagnosis not present

## 2021-04-07 DIAGNOSIS — Z79899 Other long term (current) drug therapy: Secondary | ICD-10-CM | POA: Diagnosis not present

## 2021-04-07 DIAGNOSIS — Z0000 Encounter for general adult medical examination without abnormal findings: Secondary | ICD-10-CM | POA: Diagnosis not present

## 2021-04-10 DIAGNOSIS — Z96611 Presence of right artificial shoulder joint: Secondary | ICD-10-CM | POA: Diagnosis not present

## 2021-04-10 DIAGNOSIS — M25511 Pain in right shoulder: Secondary | ICD-10-CM | POA: Diagnosis not present

## 2021-04-17 DIAGNOSIS — M25511 Pain in right shoulder: Secondary | ICD-10-CM | POA: Diagnosis not present

## 2021-04-17 DIAGNOSIS — Z96611 Presence of right artificial shoulder joint: Secondary | ICD-10-CM | POA: Diagnosis not present

## 2021-04-20 DIAGNOSIS — Z96611 Presence of right artificial shoulder joint: Secondary | ICD-10-CM | POA: Diagnosis not present

## 2021-04-20 DIAGNOSIS — M25511 Pain in right shoulder: Secondary | ICD-10-CM | POA: Diagnosis not present

## 2021-04-24 DIAGNOSIS — Z96611 Presence of right artificial shoulder joint: Secondary | ICD-10-CM | POA: Diagnosis not present

## 2021-04-24 DIAGNOSIS — M25511 Pain in right shoulder: Secondary | ICD-10-CM | POA: Diagnosis not present

## 2021-04-27 DIAGNOSIS — M25511 Pain in right shoulder: Secondary | ICD-10-CM | POA: Diagnosis not present

## 2021-04-27 DIAGNOSIS — Z96611 Presence of right artificial shoulder joint: Secondary | ICD-10-CM | POA: Diagnosis not present

## 2021-05-01 DIAGNOSIS — Z471 Aftercare following joint replacement surgery: Secondary | ICD-10-CM | POA: Diagnosis not present

## 2021-05-01 DIAGNOSIS — Z96611 Presence of right artificial shoulder joint: Secondary | ICD-10-CM | POA: Diagnosis not present

## 2021-07-13 DIAGNOSIS — Z20822 Contact with and (suspected) exposure to covid-19: Secondary | ICD-10-CM | POA: Diagnosis not present

## 2021-07-13 DIAGNOSIS — Z03818 Encounter for observation for suspected exposure to other biological agents ruled out: Secondary | ICD-10-CM | POA: Diagnosis not present

## 2021-07-16 DIAGNOSIS — U071 COVID-19: Secondary | ICD-10-CM | POA: Diagnosis not present

## 2021-07-20 DIAGNOSIS — Z20822 Contact with and (suspected) exposure to covid-19: Secondary | ICD-10-CM | POA: Diagnosis not present

## 2021-07-20 DIAGNOSIS — Z03818 Encounter for observation for suspected exposure to other biological agents ruled out: Secondary | ICD-10-CM | POA: Diagnosis not present

## 2021-08-02 DIAGNOSIS — Z96611 Presence of right artificial shoulder joint: Secondary | ICD-10-CM | POA: Diagnosis not present

## 2021-08-22 ENCOUNTER — Ambulatory Visit: Payer: Medicare HMO | Admitting: Urology

## 2021-08-22 ENCOUNTER — Other Ambulatory Visit: Payer: Self-pay

## 2021-08-22 ENCOUNTER — Encounter: Payer: Self-pay | Admitting: Urology

## 2021-08-22 VITALS — BP 118/80 | HR 84 | Ht 69.0 in | Wt 193.0 lb

## 2021-08-22 DIAGNOSIS — N529 Male erectile dysfunction, unspecified: Secondary | ICD-10-CM | POA: Diagnosis not present

## 2021-08-22 MED ORDER — SILDENAFIL CITRATE 20 MG PO TABS: ORAL_TABLET | ORAL | 3 refills | Status: DC

## 2021-08-22 NOTE — Progress Notes (Signed)
08/22/2021 10:11 AM  Raymond Flowers [birth year] [id number]  Referring Provider: Rusty Aus, MD [facility] [place],  [place] [address]  Chief Complaint  Patient presents with   Erectile Dysfunction   Urological history: 1. Testosterone deficiency - managed with Clomid 50 mg, one tablet daily  2. ED - SHIM 21 - contributing factors of age, testosterone deficiency, BPH, HLD and pre-diabetes - managed with sildenafil 20 mg and 100 mg, on-demand-dosing  3. BPH with LU TS - I PSS 5/0 - prostate volume 50 cc  4. High risk hematuria - Non-smoker - CTU 05/2020 Bilateral nonobstructive nephrolithiasis.   Several benign-appearing left renal cysts.  Mild prostatomegaly.   - Cysto 05/2020 NED - UA negative for micro heme   HPI:  Raymond Flowers is a 65 y.o. male who presents today to discuss WAVE therapy.  He states he is having to take 150 mg of sildenafil in order to have satisfactory erection for intercourse.  He denies any pain with intercourse or curvature with his erections.  PMH: Past Medical History:  Diagnosis Date   Androgen deficiency    Arthritis    shoulders, toe   Benign enlargement of prostate    Blood in semen    BPH (benign prostatic hyperplasia)    Erectile dysfunction    History of kidney stones    Hypertension    Impotence    Skin cancer, basal cell    Resected from Right facial area.    Past Surgical History:  Procedure Laterality Date   KNEE ARTHROSCOPY Right 2016   REVERSE SHOULDER ARTHROPLASTY Right 02/02/2021   Procedure: REVERSE SHOULDER ARTHROPLASTY;  Surgeon: Justice Britain, MD;  Location: [facility];  Service: Orthopedics;  Laterality: Right;  169min   SHOULDER SURGERY  2009   right  shoulder x 2  (20016)   TONSILLECTOMY     tricep surgery Left 2015   VARICOCELE EXCISION     Allergies as of 08/22/2021       Reactions   Codeine Other (See Comments)   Reaction:  Hallucinations    Oxycodone    Other reaction(s): Hallucination        Medication List        Accurate as of August 22, 2021 10:11 AM. If you have any questions, ask your nurse or doctor.          STOP taking these medications    cyclobenzaprine 10 MG tablet Commonly known as: FLEXERIL Stopped by: Nevaan Bunton, PA-C   HYDROcodone-acetaminophen 5-325 MG tablet Commonly known as: NORCO/VICODIN Stopped by: Crandall Harvel, PA-C   naproxen 500 MG tablet Commonly known as: Naprosyn Stopped by: Elinda Bunten, PA-C   ondansetron 4 MG tablet Commonly known as: Zofran Stopped by: Jaydian Santana, PA-C   traMADol 50 MG tablet Commonly known as: ULTRAM Stopped by: Zara Council, PA-C       TAKE these medications    aspirin 81 MG tablet Take 81 mg by mouth daily.   clomiPHENE 50 MG tablet Commonly known as: CLOMID Take 1 tablet (50 mg total) by mouth daily.   hydrochlorothiazide 25 MG tablet Commonly known as: HYDRODIURIL Take 25 mg by mouth daily.   OCUVITE ADULT 50+ PO Take 1 tablet by mouth daily.   sildenafil 100 MG tablet Commonly known as: VIAGRA TAKE ONE TABLET BY MOUTH DAILY AS NEEDED FOR ERECTILE DYSFUNCTION - TAKE TWO HOURS PRIOR TO INTERCOURSE ON AN EMPTY STOMACH What changed: See  the new instructions.   sildenafil 20 MG tablet Commonly known as: REVATIO Take 3 to 5 tablets two hours before intercouse on an empty stomach.  Do not take with nitrates. Started by: Zara Council, PA-C   vitamin B-12 1000 MCG tablet Commonly known as: CYANOCOBALAMIN Take 1,000 mcg by mouth daily.        Allergies  Allergen Reactions   Codeine Other (See Comments)    Reaction:  Hallucinations    Oxycodone     Other reaction(s): Hallucination   Family History  Problem Relation Age of Onset   Heart disease Mother 29   Hypertension Mother    Heart disease Father 33       MI    Kidney Stones Father        mother   Hypertension Brother    Prostate cancer Neg Hx     Kidney disease Neg Hx    Kidney cancer Neg Hx    Bladder Cancer Neg Hx    Social History: reports that he has never smoked. He has never used smokeless tobacco. He reports current alcohol use of about 2.0 standard drinks of alcohol per week. He reports that he does not use drugs.   Pertinent ROS in HPI  Physical Exam: BP 118/80 (BP Location: Left Arm, Patient Position: Sitting, Cuff Size: Large)   Pulse 84   Ht 5\' 9"  (1.753 m)   Wt 193 lb (87.5 kg)   BMI 28.50 kg/m   Constitutional:  Well nourished. Alert and oriented, No acute distress. HEENT: [place] AT, mask in place.  Trachea midline Cardiovascular: No clubbing, cyanosis, or edema. Respiratory: Normal respiratory effort, no increased work of breathing. Neurologic: Grossly intact, no focal deficits, moving all 4 extremities. Psychiatric: Normal mood and affect.   Laboratory Results N/A    Assessment & Plan:   1. High risk hematuria - negative work up 05/2020  2. Testosterone deficiency  - Continue Clomid 50 mg 1 tablet daily; prescription sent to pharmacy   3. BPH with LUTS  - Continue conservative management, avoiding bladder irritants and timed voiding's   4. Erectile dysfunction -Discussed with patient that I have not had a lot of clinical experience with the wave therapy -[facility] [place] [facility] -I also gave him an article regarding shockwave therapy for ED out of the [facility] news of November 2022  Return for Follow-up in March as scheduled.   Zara Council, PA-C  [facility]  [address] [place]  [place], [place] [address]  [phone]

## 2021-09-11 DIAGNOSIS — H524 Presbyopia: Secondary | ICD-10-CM | POA: Diagnosis not present

## 2021-09-11 DIAGNOSIS — H40003 Preglaucoma, unspecified, bilateral: Secondary | ICD-10-CM | POA: Diagnosis not present

## 2021-10-02 DIAGNOSIS — E291 Testicular hypofunction: Secondary | ICD-10-CM | POA: Diagnosis not present

## 2021-10-02 DIAGNOSIS — N529 Male erectile dysfunction, unspecified: Secondary | ICD-10-CM | POA: Diagnosis not present

## 2021-10-05 DIAGNOSIS — Z0100 Encounter for examination of eyes and vision without abnormal findings: Secondary | ICD-10-CM | POA: Diagnosis not present

## 2021-10-20 NOTE — Progress Notes (Signed)
10/23/2021 10:33 AM  Raymond Flowers May 26, 1956 [id number]  Referring Provider: Rusty Aus, MD [facility] [place],  [place] [address]  Chief Complaint  Patient presents with   Follow-up    Discuss medications   Urological history: 1. Testosterone deficiency -Contributing factors of age  66. ED - contributing factors of age, testosterone deficiency, BPH, HLD and pre-diabetes - managed with sildenafil 20 mg and 100 mg, on-demand-dosing  3. BPH with LU TS - prostate volume 50 cc  4. High risk hematuria - Non-smoker - CTU 05/2020 Bilateral nonobstructive nephrolithiasis.   Several benign-appearing left renal cysts.  Mild prostatomegaly.   - Cysto 05/2020 NED  HPI:  Raymond Flowers is a 66 y.o. male who presents today to discuss alternative TRT as there is a shortage of Clomid.  He had been on Axiron previously.  He had researched medications and has found that AndroGel 1.62%, 2 pumps daily was the most cost effective medication.  He had also been evaluated for the lithotripsy for ED and it was cost prohibitive for the equivocal results.    PMH: Past Medical History:  Diagnosis Date   Androgen deficiency    Arthritis    shoulders, toe   Benign enlargement of prostate    Blood in semen    BPH (benign prostatic hyperplasia)    Erectile dysfunction    History of kidney stones    Hypertension    Impotence    Skin cancer, basal cell    Resected from Right facial area.    Past Surgical History:  Procedure Laterality Date   KNEE ARTHROSCOPY Right 2016   REVERSE SHOULDER ARTHROPLASTY Right 02/02/2021   Procedure: REVERSE SHOULDER ARTHROPLASTY;  Surgeon: Justice Britain, MD;  Location: [facility];  Service: Orthopedics;  Laterality: Right;  135min   SHOULDER SURGERY  2009   right  shoulder x 2  (20016)   TONSILLECTOMY     tricep surgery Left 2015   VARICOCELE EXCISION     Allergies as of 10/23/2021       Reactions    Codeine Other (See Comments)   Reaction:  Hallucinations   Oxycodone    Other reaction(s): Hallucination        Medication List        Accurate as of October 23, 2021 10:33 AM. If you have any questions, ask your nurse or doctor.          aspirin 81 MG tablet Take 81 mg by mouth daily.   clomiPHENE 50 MG tablet Commonly known as: CLOMID Take 1 tablet (50 mg total) by mouth daily.   hydrochlorothiazide 25 MG tablet Commonly known as: HYDRODIURIL Take 25 mg by mouth daily.   OCUVITE ADULT 50+ PO Take 1 tablet by mouth daily.   sildenafil 100 MG tablet Commonly known as: VIAGRA TAKE ONE TABLET BY MOUTH DAILY AS NEEDED FOR ERECTILE DYSFUNCTION - TAKE TWO HOURS PRIOR TO INTERCOURSE ON AN EMPTY STOMACH   sildenafil 20 MG tablet Commonly known as: REVATIO Take 3 to 5 tablets two hours before intercouse on an empty stomach.  Do not take with nitrates.   Testosterone 40.5 MG/2.5GM (1.62%) Gel Place 2 Act onto the skin daily. 1 gel pump Started by: Zara Council, PA-C   vitamin B-12 1000 MCG tablet Commonly known as: CYANOCOBALAMIN Take 1,000 mcg by mouth daily.        Allergies  Allergen Reactions   Codeine Other (See Comments)  Reaction:  Hallucinations    Oxycodone     Other reaction(s): Hallucination   Family History  Problem Relation Age of Onset   Heart disease Mother 43   Hypertension Mother    Heart disease Father 38       MI    Kidney Stones Father        mother   Hypertension Brother    Prostate cancer Neg Hx    Kidney disease Neg Hx    Kidney cancer Neg Hx    Bladder Cancer Neg Hx    Social History: reports that he has never smoked. He has never used smokeless tobacco. He reports current alcohol use of about 2.0 standard drinks of alcohol per week. He reports that he does not use drugs.   Pertinent ROS in HPI  Physical Exam: BP (!) 144/83    Pulse 64    Ht 5\' 10"  (1.778 m)    Wt 203 lb (92.1 kg)    BMI 29.13 kg/m    Constitutional:  Well nourished. Alert and oriented, No acute distress. HEENT: [place] AT, mask in place.  Trachea midline Cardiovascular: No clubbing, cyanosis, or edema. Respiratory: Normal respiratory effort, no increased work of breathing. Neurologic: Grossly intact, no focal deficits, moving all 4 extremities. Psychiatric: Normal mood and affect.   Laboratory Results N/A  Pertinent imaging N/A   Assessment & Plan:   1. Testosterone deficiency  -Discussed transference issues with the gel and reviewed the side effects of testosterone therapy -As one of the possible side effects of testosterone therapy is down-regulation of the patient's all-natural testosterone, he would like to wait until March to either start the gel or restart Clomid if the generic becomes available -If he becomes symptomatic prior to March-we will send in a prescription for the AndroGel  2. Erectile dysfunction -LI-ESWT for ED would cost $600 per session and required 6 weekly treatments for results that may last about 6 months -He was prescribed tadalafil 5 mg daily and to supplement with the sildenafil 20 to 200 mg on days for intercourse and that seems to be working well for him  Return for keep follow up in March .   Zara Council, PA-C  [facility]  [address] [place]  [place], [place] [address]  [phone]

## 2021-10-23 ENCOUNTER — Ambulatory Visit: Payer: Medicare HMO | Admitting: Urology

## 2021-10-23 ENCOUNTER — Other Ambulatory Visit: Payer: Self-pay

## 2021-10-23 ENCOUNTER — Encounter: Payer: Self-pay | Admitting: Urology

## 2021-10-23 VITALS — BP 144/83 | HR 64 | Ht 70.0 in | Wt 203.0 lb

## 2021-10-23 DIAGNOSIS — E349 Endocrine disorder, unspecified: Secondary | ICD-10-CM | POA: Diagnosis not present

## 2021-10-23 DIAGNOSIS — N529 Male erectile dysfunction, unspecified: Secondary | ICD-10-CM

## 2021-10-23 MED ORDER — TESTOSTERONE 40.5 MG/2.5GM (1.62%) TD GEL: 2.0000 | Freq: Every day | TRANSDERMAL | 4 refills | Status: DC

## 2021-11-17 DIAGNOSIS — Z96611 Presence of right artificial shoulder joint: Secondary | ICD-10-CM | POA: Diagnosis not present

## 2021-11-24 DIAGNOSIS — H0012 Chalazion right lower eyelid: Secondary | ICD-10-CM | POA: Diagnosis not present

## 2021-11-28 ENCOUNTER — Other Ambulatory Visit: Payer: Self-pay

## 2021-11-28 ENCOUNTER — Other Ambulatory Visit: Payer: Medicare HMO

## 2021-11-28 DIAGNOSIS — R972 Elevated prostate specific antigen [PSA]: Secondary | ICD-10-CM

## 2021-11-28 DIAGNOSIS — N401 Enlarged prostate with lower urinary tract symptoms: Secondary | ICD-10-CM

## 2021-11-28 DIAGNOSIS — N138 Other obstructive and reflux uropathy: Secondary | ICD-10-CM | POA: Diagnosis not present

## 2021-11-28 DIAGNOSIS — E349 Endocrine disorder, unspecified: Secondary | ICD-10-CM

## 2021-11-29 LAB — PSA: Prostate Specific Ag, Serum: 1.1 ng/mL (ref 0.0–4.0)

## 2021-11-29 LAB — HEPATIC FUNCTION PANEL
ALT: 16 IU/L (ref 0–44)
AST: 20 IU/L (ref 0–40)
Albumin: 4.2 g/dL (ref 3.8–4.8)
Alkaline Phosphatase: 50 IU/L (ref 44–121)
Bilirubin Total: 0.3 mg/dL (ref 0.0–1.2)
Bilirubin, Direct: 0.12 mg/dL (ref 0.00–0.40)
Total Protein: 6.4 g/dL (ref 6.0–8.5)

## 2021-11-29 LAB — HEMOGLOBIN AND HEMATOCRIT, BLOOD
Hematocrit: 45.6 % (ref 37.5–51.0)
Hemoglobin: 16 g/dL (ref 13.0–17.7)

## 2021-11-29 LAB — TESTOSTERONE: Testosterone: 647 ng/dL (ref 264–916)

## 2021-12-01 ENCOUNTER — Other Ambulatory Visit: Payer: Self-pay

## 2021-12-04 DIAGNOSIS — M519 Unspecified thoracic, thoracolumbar and lumbosacral intervertebral disc disorder: Secondary | ICD-10-CM | POA: Diagnosis not present

## 2021-12-04 DIAGNOSIS — J019 Acute sinusitis, unspecified: Secondary | ICD-10-CM | POA: Diagnosis not present

## 2021-12-05 ENCOUNTER — Ambulatory Visit: Payer: Self-pay | Admitting: Urology

## 2021-12-07 DIAGNOSIS — L570 Actinic keratosis: Secondary | ICD-10-CM | POA: Diagnosis not present

## 2021-12-07 DIAGNOSIS — L814 Other melanin hyperpigmentation: Secondary | ICD-10-CM | POA: Diagnosis not present

## 2021-12-18 NOTE — Progress Notes (Signed)
12/19/21 ?3:41 PM  ? ?Raymond Flowers ?06/24/1956 ?[id number] ? ?Referring provider:  ?Rusty Aus, MD ?[place] ?[facility] ?[place],  [place] [address] ? ?Chief Complaint  ?Patient presents with  ? Testosterone deficiency  ? ? ?Urological history  ?1. Testosterone deficiency ?-Contributing factors of age ?- Testosterone 647 on 11/28/2021 ?- H&H WNL on 11/28/2021 ? ?2. ED ?- contributing factors of age, testosterone deficiency, BPH, HLD and pre-diabetes ?- managed with tadalafil 5 mg daily and to supplement with the sildenafil 20 to 200 mg on days for intercourse ?- SHIM 22  ? ?3. BPH with LU TS ?- prostate volume 50 cc ?- IPSS 8/0 ?- PSA 1.1 11/2021 ?  ?4. High risk hematuria ?- Non-smoker ?- CTU 05/2020 Bilateral nonobstructive nephrolithiasis.   Several benign-appearing left renal cysts.  Mild prostatomegaly.   ?- Cysto 05/2020 NED ?- no reports of gross heme ?- UA negative for micro heme 03/2021 ? ? ?HPI: ?Raymond Flowers is a 66 y.o.male who presents today for a 1 year follow-up. ? ?He has nocturia x 1.  Patient denies any modifying or aggravating factors.  Patient denies any gross hematuria, dysuria or suprapubic/flank pain. Patient denies any fevers, chills, nausea or vomiting.  ? ? ? IPSS   ? ? [place] Name 12/19/21 1400  ?  ?  ?  ? International Prostate Symptom Score  ? How often have you had the sensation of not emptying your bladder? Not at All    ? How often have you had to urinate less than every two hours? About half the time    ? How often have you found you stopped and started again several times when you urinated? Less than 1 in 5 times    ? How often have you found it difficult to postpone urination? Not at All    ? How often have you had a weak urinary stream? Not at All    ? How often have you had to strain to start urination? Less than 1 in 5 times    ? How many times did you typically get up at night to urinate? 3 Times    ? Total IPSS Score 8    ?  ? Quality  of Life due to urinary symptoms  ? If you were to spend the rest of your life with your urinary condition just the way it is now how would you feel about that? Delighted    ? ?  ?  ? ?  ? ? ?Score:  ?1-7 Mild ?8-19 Moderate ?20-35 Severe ? ?Patient still having spontaneous erections.  He denies any pain or curvature with erections.   ? SHIM   ? ? [place] Name 12/19/21 1427  ?  ?  ?  ? SHIM: Over the last 6 months:  ? How do you rate your confidence that you could get and keep an erection? Moderate    ? When you had erections with sexual stimulation, how often were your erections hard enough for penetration (entering your partner)? Almost Always or Always    ? During sexual intercourse, how often were you able to maintain your erection after you had penetrated (entered) your partner? Almost Always or Always    ? During sexual intercourse, how difficult was it to maintain your erection to completion of intercourse? Slightly Difficult    ? When you attempted sexual intercourse, how often was it satisfactory for you? Almost Always or Always    ?  ?  SHIM Total Score  ? SHIM 22    ? ?  ?  ? ?  ? ? ? ? ?PMH: ?Past Medical History:  ?Diagnosis Date  ? Androgen deficiency   ? Arthritis   ? shoulders, toe  ? Benign enlargement of prostate   ? Blood in semen   ? BPH (benign prostatic hyperplasia)   ? Erectile dysfunction   ? History of kidney stones   ? Hypertension   ? Impotence   ? Skin cancer, basal cell   ? Resected from Right facial area.   ? ? ?Surgical History: ?Past Surgical History:  ?Procedure Laterality Date  ? KNEE ARTHROSCOPY Right 2016  ? REVERSE SHOULDER ARTHROPLASTY Right 02/02/2021  ? Procedure: REVERSE SHOULDER ARTHROPLASTY;  Surgeon: Justice Britain, MD;  Location: [facility];  Service: Orthopedics;  Laterality: Right;  186min  ? SHOULDER SURGERY  2009  ? right  shoulder x 2  (20016)  ? TONSILLECTOMY    ? tricep surgery Left 2015  ? VARICOCELE EXCISION    ? ? ?Home Medications:  ?Allergies as of 12/19/2021   ? ?    Reactions  ? Codeine Other (See Comments)  ? Reaction:  Hallucinations  ? Oxycodone   ? Other reaction(s): Hallucination  ? ?  ? ?  ?Medication List  ?  ? ?  ? Accurate as of December 19, 2021  3:41 PM. If you have any questions, ask your nurse or doctor.  ?  ?  ? ?  ? ?STOP taking these medications   ? ?clomiPHENE 50 MG tablet ?Commonly known as: CLOMID ?Stopped by: Zara Council, PA-C ?  ?Testosterone 40.5 MG/2.5GM (1.62%) Gel ?Stopped by: Zara Council, PA-C ?  ? ?  ? ?TAKE these medications   ? ?aspirin 81 MG tablet ?Take 81 mg by mouth daily. ?  ?hydrochlorothiazide 25 MG tablet ?Commonly known as: HYDRODIURIL ?Take 25 mg by mouth daily. ?  ?OCUVITE ADULT 50+ PO ?Take 1 tablet by mouth daily. ?  ?sildenafil 100 MG tablet ?Commonly known as: VIAGRA ?TAKE ONE TABLET BY MOUTH DAILY AS NEEDED FOR ERECTILE DYSFUNCTION - TAKE TWO HOURS PRIOR TO INTERCOURSE ON AN EMPTY STOMACH ?  ?sildenafil 20 MG tablet ?Commonly known as: REVATIO ?Take 3 to 5 tablets two hours before intercouse on an empty stomach.  Do not take with nitrates. ?  ?vitamin B-12 1000 MCG tablet ?Commonly known as: CYANOCOBALAMIN ?Take 1,000 mcg by mouth daily. ?  ? ?  ? ? ?Allergies:  ?Allergies  ?Allergen Reactions  ? Codeine Other (See Comments)  ?  Reaction:  Hallucinations ?  ? Oxycodone   ?  Other reaction(s): Hallucination  ? ? ?Family History: ?Family History  ?Problem Relation Age of Onset  ? Heart disease Mother 35  ? Hypertension Mother   ? Heart disease Father 42  ?     MI   ? Kidney Stones Father   ?     mother  ? Hypertension Brother   ? Prostate cancer Neg Hx   ? Kidney disease Neg Hx   ? Kidney cancer Neg Hx   ? Bladder Cancer Neg Hx   ? ? ?Social History:  reports that he has never smoked. He has never used smokeless tobacco. He reports current alcohol use of about 2.0 standard drinks per week. He reports that he does not use drugs. ? ? ?Physical Exam: ?BP 135/78   Pulse 69   Ht $R'5\' 10"'KG$  (1.778 m)   Wt 195 lb (88.5  kg)   BMI 27.98  kg/m?   ?Constitutional:  Alert and oriented, No acute distress. ?HEENT: [place] AT, moist mucus membranes.  Trachea midline, no masses. ?Cardiovascular: No clubbing, cyanosis, or edema. ?Respiratory: Normal respiratory effort, no increased work of breathing. ?GU: No CVA tenderness.  No bladder fullness or masses.  Patient with circumcised phallus. Foreskin easily retracted  Urethral meatus is patent.  No penile discharge. No penile lesions or rashes. Scrotum without lesions, cysts, rashes and/or edema.  Bilateral hydroceles, testicles are located scrotally bilaterally. No masses are appreciated in the testicles. Left and right epididymis are normal. ?Rectal: Patient with  normal sphincter tone. Anus and perineum without scarring or rashes. No rectal masses are appreciated. Prostate is approximately 50 grams, could only palpate the apex, no nodules are appreciated. Seminal vesicles could not be palpated ?Skin: No rashes, bruises or suspicious lesions. ?Neurologic: Grossly intact, no focal deficits, moving all 4 extremities. ?Psychiatric: Normal mood and affect. ? ? ?Laboratory Data: ?Lab Results  ?Component Value Date  ? CREATININE 1.26 (H) 01/24/2021  ? ?Glucose 70 - 110 mg/dL 106   ?Sodium 136 - 145 mmol/L 141   ?Potassium 3.6 - 5.1 mmol/L 3.9   ?Chloride 97 - 109 mmol/L 105   ?Carbon Dioxide (CO2) 22.0 - 32.0 mmol/L 29.7   ?Urea Nitrogen (BUN) 7 - 25 mg/dL 19   ?Creatinine 0.7 - 1.3 mg/dL 1.2   ?Glomerular Filtration Rate (eGFR), MDRD Estimate >60 mL/min/1.73sq m 61   ?Calcium 8.7 - 10.3 mg/dL 9.3   ?AST  8 - 39 U/L 18   ?ALT  6 - 57 U/L 16   ?Alk Phos (alkaline Phosphatase) 34 - 104 U/L 38   ?Albumin 3.5 - 4.8 g/dL 4.2   ?Bilirubin, Total 0.3 - 1.2 mg/dL 0.6   ?Protein, Total 6.1 - 7.9 g/dL 6.9   ?A/G Ratio 1.0 - 5.0 gm/dL 1.6   ?Resulting Agency  [facility]  ?Specimen Collected: 03/31/21 07:16 Last Resulted: 03/31/21 11:46  ?Received From: [facility]  Result Received: 06/30/21  08:03  ? ?Component ?    Latest Ref Rng 11/28/2021  ?Total Protein ?    6.0 - 8.5 g/dL 6.4   ?Albumin ?    3.8 - 4.8 g/dL 4.2   ?Total Bilirubin ?    0.0 - 1.2 mg/dL 0.3   ?BILIRUBIN, DIRECT ?    0.00 - 0.40 mg/dL 0.

## 2021-12-19 ENCOUNTER — Other Ambulatory Visit: Payer: Self-pay

## 2021-12-19 ENCOUNTER — Encounter: Payer: Self-pay | Admitting: Urology

## 2021-12-19 ENCOUNTER — Ambulatory Visit: Payer: Medicare HMO | Admitting: Urology

## 2021-12-19 VITALS — BP 135/78 | HR 69 | Ht 70.0 in | Wt 195.0 lb

## 2021-12-19 DIAGNOSIS — N401 Enlarged prostate with lower urinary tract symptoms: Secondary | ICD-10-CM | POA: Diagnosis not present

## 2021-12-19 DIAGNOSIS — E349 Endocrine disorder, unspecified: Secondary | ICD-10-CM

## 2021-12-19 DIAGNOSIS — N138 Other obstructive and reflux uropathy: Secondary | ICD-10-CM

## 2021-12-19 DIAGNOSIS — N529 Male erectile dysfunction, unspecified: Secondary | ICD-10-CM

## 2021-12-19 MED ORDER — SILDENAFIL CITRATE 20 MG PO TABS: ORAL_TABLET | ORAL | 3 refills | Status: DC

## 2021-12-21 ENCOUNTER — Other Ambulatory Visit: Payer: Self-pay | Admitting: Urology

## 2021-12-21 MED ORDER — TADALAFIL 5 MG PO TABS: 5.0000 mg | ORAL_TABLET | Freq: Every day | ORAL | 3 refills | Status: DC | PRN

## 2021-12-22 ENCOUNTER — Other Ambulatory Visit: Payer: Self-pay | Admitting: Urology

## 2021-12-22 DIAGNOSIS — N138 Other obstructive and reflux uropathy: Secondary | ICD-10-CM

## 2021-12-22 MED ORDER — TADALAFIL 5 MG PO TABS: 5.0000 mg | ORAL_TABLET | Freq: Every day | ORAL | 3 refills | Status: DC | PRN

## 2022-01-16 ENCOUNTER — Other Ambulatory Visit: Payer: Self-pay | Admitting: Urology

## 2022-02-05 DIAGNOSIS — Z96611 Presence of right artificial shoulder joint: Secondary | ICD-10-CM | POA: Diagnosis not present

## 2022-03-19 DIAGNOSIS — H0015 Chalazion left lower eyelid: Secondary | ICD-10-CM | POA: Diagnosis not present

## 2022-03-22 ENCOUNTER — Other Ambulatory Visit: Payer: Medicare HMO

## 2022-03-22 DIAGNOSIS — E349 Endocrine disorder, unspecified: Secondary | ICD-10-CM | POA: Diagnosis not present

## 2022-03-22 DIAGNOSIS — N401 Enlarged prostate with lower urinary tract symptoms: Secondary | ICD-10-CM | POA: Diagnosis not present

## 2022-03-22 DIAGNOSIS — N529 Male erectile dysfunction, unspecified: Secondary | ICD-10-CM | POA: Diagnosis not present

## 2022-03-22 DIAGNOSIS — N138 Other obstructive and reflux uropathy: Secondary | ICD-10-CM | POA: Diagnosis not present

## 2022-03-23 LAB — TESTOSTERONE: Testosterone: 557 ng/dL (ref 264–916)

## 2022-04-02 DIAGNOSIS — Z79899 Other long term (current) drug therapy: Secondary | ICD-10-CM | POA: Diagnosis not present

## 2022-04-02 DIAGNOSIS — Z125 Encounter for screening for malignant neoplasm of prostate: Secondary | ICD-10-CM | POA: Diagnosis not present

## 2022-04-02 DIAGNOSIS — E538 Deficiency of other specified B group vitamins: Secondary | ICD-10-CM | POA: Diagnosis not present

## 2022-04-10 DIAGNOSIS — Z125 Encounter for screening for malignant neoplasm of prostate: Secondary | ICD-10-CM | POA: Diagnosis not present

## 2022-04-10 DIAGNOSIS — Z0000 Encounter for general adult medical examination without abnormal findings: Secondary | ICD-10-CM | POA: Diagnosis not present

## 2022-04-10 DIAGNOSIS — Z1389 Encounter for screening for other disorder: Secondary | ICD-10-CM | POA: Diagnosis not present

## 2022-04-10 DIAGNOSIS — Z79899 Other long term (current) drug therapy: Secondary | ICD-10-CM | POA: Diagnosis not present

## 2022-04-23 DIAGNOSIS — L578 Other skin changes due to chronic exposure to nonionizing radiation: Secondary | ICD-10-CM | POA: Diagnosis not present

## 2022-04-23 DIAGNOSIS — Z859 Personal history of malignant neoplasm, unspecified: Secondary | ICD-10-CM | POA: Diagnosis not present

## 2022-04-23 DIAGNOSIS — L570 Actinic keratosis: Secondary | ICD-10-CM | POA: Diagnosis not present

## 2022-04-23 DIAGNOSIS — Z85820 Personal history of malignant melanoma of skin: Secondary | ICD-10-CM | POA: Diagnosis not present

## 2022-04-23 DIAGNOSIS — Z872 Personal history of diseases of the skin and subcutaneous tissue: Secondary | ICD-10-CM | POA: Diagnosis not present

## 2022-08-09 DIAGNOSIS — J40 Bronchitis, not specified as acute or chronic: Secondary | ICD-10-CM | POA: Diagnosis not present

## 2022-08-30 DIAGNOSIS — M25522 Pain in left elbow: Secondary | ICD-10-CM | POA: Diagnosis not present

## 2022-08-30 DIAGNOSIS — S46302A Unspecified injury of muscle, fascia and tendon of triceps, left arm, initial encounter: Secondary | ICD-10-CM | POA: Diagnosis not present

## 2022-09-13 DIAGNOSIS — H40003 Preglaucoma, unspecified, bilateral: Secondary | ICD-10-CM | POA: Diagnosis not present

## 2022-09-13 DIAGNOSIS — Z0100 Encounter for examination of eyes and vision without abnormal findings: Secondary | ICD-10-CM | POA: Diagnosis not present

## 2022-11-13 DIAGNOSIS — R0981 Nasal congestion: Secondary | ICD-10-CM | POA: Diagnosis not present

## 2022-12-04 DIAGNOSIS — L821 Other seborrheic keratosis: Secondary | ICD-10-CM | POA: Diagnosis not present

## 2022-12-04 DIAGNOSIS — L814 Other melanin hyperpigmentation: Secondary | ICD-10-CM | POA: Diagnosis not present

## 2022-12-04 DIAGNOSIS — L570 Actinic keratosis: Secondary | ICD-10-CM | POA: Diagnosis not present

## 2022-12-18 ENCOUNTER — Other Ambulatory Visit: Payer: Medicare HMO

## 2022-12-18 DIAGNOSIS — N138 Other obstructive and reflux uropathy: Secondary | ICD-10-CM | POA: Diagnosis not present

## 2022-12-18 DIAGNOSIS — E349 Endocrine disorder, unspecified: Secondary | ICD-10-CM

## 2022-12-18 DIAGNOSIS — N401 Enlarged prostate with lower urinary tract symptoms: Secondary | ICD-10-CM | POA: Diagnosis not present

## 2022-12-19 LAB — PSA: Prostate Specific Ag, Serum: 1.1 ng/mL (ref 0.0–4.0)

## 2022-12-19 LAB — HEMOGLOBIN AND HEMATOCRIT, BLOOD
Hematocrit: 46.9 % (ref 37.5–51.0)
Hemoglobin: 15.9 g/dL (ref 13.0–17.7)

## 2022-12-19 LAB — TESTOSTERONE: Testosterone: 531 ng/dL (ref 264–916)

## 2022-12-20 NOTE — Progress Notes (Deleted)
12/19/21 3:41 PM   Raymond Flowers 09-Oct-1955 [id number]  Referring provider:  Rusty Aus, MD [facility] [place],  [place] [address]  Chief Complaint  Patient presents with   Testosterone deficiency    Urological history  1. Testosterone deficiency - Contributing factors of age - Testosterone (11/2022) 531 - hemoglobin/hematocrit (11/2022) 15.9/46.9  2. ED - contributing factors of age, testosterone deficiency***, BPH, HLD and pre-diabetes - managed with tadalafil 5 mg daily and to supplement with the sildenafil 20 to 200 mg on days for intercourse  3. BPH with LU TS - PSA (11/2022) 1.1 - prostate volume 50 cc   4. High risk hematuria - Non-smoker - CTU 05/2020 Bilateral nonobstructive nephrolithiasis.   Several benign-appearing left renal cysts.  Mild prostatomegaly.   - Cysto 05/2020 NED - no reports of gross heme - UA (03/2022) negative for micro heme  HPI: Raymond Flowers is a 67 y.o.male who presents today for a 1 year follow-up.  I PSS ***  He has nocturia x 1.  Patient denies any modifying or aggravating factors.  Patient denies any gross hematuria, dysuria or suprapubic/flank pain. Patient denies any fevers, chills, nausea or vomiting.     IPSS     Row Name 12/19/21 1400         International Prostate Symptom Score   How often have you had the sensation of not emptying your bladder? Not at All     How often have you had to urinate less than every two hours? About half the time     How often have you found you stopped and started again several times when you urinated? Less than 1 in 5 times     How often have you found it difficult to postpone urination? Not at All     How often have you had a weak urinary stream? Not at All     How often have you had to strain to start urination? Less than 1 in 5 times     How many times did you typically get up at night to urinate? 3 Times     Total IPSS Score 8        Quality of Life due to urinary symptoms   If you were to spend the rest of your life with your urinary condition just the way it is now how would you feel about that? Delighted              Score:  1-7 Mild 8-19 Moderate 20-35 Severe  SHIM ***  Patient still having spontaneous erections.  He denies any pain or curvature with erections.     SHIM     Row Name 12/19/21 1427         SHIM: Over the last 6 months:   How do you rate your confidence that you could get and keep an erection? Moderate     When you had erections with sexual stimulation, how often were your erections hard enough for penetration (entering your partner)? Almost Always or Always     During sexual intercourse, how often were you able to maintain your erection after you had penetrated (entered) your partner? Almost Always or Always     During sexual intercourse, how difficult was it to maintain your erection to completion of intercourse? Slightly Difficult     When you attempted sexual intercourse, how often was it satisfactory for you? Almost Always or Always  SHIM Total Score   SHIM 22                PMH: Past Medical History:  Diagnosis Date   Androgen deficiency    Arthritis    shoulders, toe   Benign enlargement of prostate    Blood in semen    BPH (benign prostatic hyperplasia)    Erectile dysfunction    History of kidney stones    Hypertension    Impotence    Skin cancer, basal cell    Resected from Right facial area.     Surgical History: Past Surgical History:  Procedure Laterality Date   KNEE ARTHROSCOPY Right 2016   REVERSE SHOULDER ARTHROPLASTY Right 02/02/2021   Procedure: REVERSE SHOULDER ARTHROPLASTY;  Surgeon: Justice Britain, MD;  Location: [facility];  Service: Orthopedics;  Laterality: Right;  172min   SHOULDER SURGERY  2009   right  shoulder x 2  (20016)   TONSILLECTOMY     tricep surgery Left 2015   VARICOCELE EXCISION      Home Medications:  Allergies  as of 12/19/2021       Reactions   Codeine Other (See Comments)   Reaction:  Hallucinations   Oxycodone    Other reaction(s): Hallucination        Medication List        Accurate as of December 19, 2021  3:41 PM. If you have any questions, ask your nurse or doctor.          STOP taking these medications    clomiPHENE 50 MG tablet Commonly known as: CLOMID Stopped by: Zara Council, PA-C   Testosterone 40.5 MG/2.5GM (1.62%) Gel Stopped by: Zara Council, PA-C       TAKE these medications    aspirin 81 MG tablet Take 81 mg by mouth daily.   hydrochlorothiazide 25 MG tablet Commonly known as: HYDRODIURIL Take 25 mg by mouth daily.   OCUVITE ADULT 50+ PO Take 1 tablet by mouth daily.   sildenafil 100 MG tablet Commonly known as: VIAGRA TAKE ONE TABLET BY MOUTH DAILY AS NEEDED FOR ERECTILE DYSFUNCTION - TAKE TWO HOURS PRIOR TO INTERCOURSE ON AN EMPTY STOMACH   sildenafil 20 MG tablet Commonly known as: REVATIO Take 3 to 5 tablets two hours before intercouse on an empty stomach.  Do not take with nitrates.   vitamin B-12 1000 MCG tablet Commonly known as: CYANOCOBALAMIN Take 1,000 mcg by mouth daily.        Allergies:  Allergies  Allergen Reactions   Codeine Other (See Comments)    Reaction:  Hallucinations    Oxycodone     Other reaction(s): Hallucination    Family History: Family History  Problem Relation Age of Onset   Heart disease Mother 28   Hypertension Mother    Heart disease Father 41       MI    Kidney Stones Father        mother   Hypertension Brother    Prostate cancer Neg Hx    Kidney disease Neg Hx    Kidney cancer Neg Hx    Bladder Cancer Neg Hx     Social History:  reports that he has never smoked. He has never used smokeless tobacco. He reports current alcohol use of about 2.0 standard drinks per week. He reports that he does not use drugs.   Physical Exam: BP 135/78   Pulse 69   Ht 5\' 10"  (1.778 m)   Wt 195 lb  (  88.5 kg)   BMI 27.98 kg/m   Constitutional:  Well nourished. Alert and oriented, No acute distress. HEENT: [place] AT, moist mucus membranes.  Trachea midline Cardiovascular: No clubbing, cyanosis, or edema. Respiratory: Normal respiratory effort, no increased work of breathing. GU: No CVA tenderness.  No bladder fullness or masses.  Patient with circumcised/uncircumcised phallus. ***Foreskin easily retracted***  Urethral meatus is patent.  No penile discharge. No penile lesions or rashes. Scrotum without lesions, cysts, rashes and/or edema.  Testicles are located scrotally bilaterally. No masses are appreciated in the testicles. Left and right epididymis are normal. Rectal: Patient with  normal sphincter tone. Anus and perineum without scarring or rashes. No rectal masses are appreciated. Prostate is approximately *** grams, *** nodules are appreciated. Seminal vesicles are normal. Neurologic: Grossly intact, no focal deficits, moving all 4 extremities. Psychiatric: Normal mood and affect.   Laboratory Data: Results for orders placed or performed in visit on 12/18/22  Testosterone  Result Value Ref Range   Testosterone 531 264 - 916 ng/dL  PSA  Result Value Ref Range   Prostate Specific Ag, Serum 1.1 0.0 - 4.0 ng/mL  Hemoglobin and Hematocrit, Blood  Result Value Ref Range   Hemoglobin 15.9 13.0 - 17.7 g/dL   Hematocrit 46.9 37.5 - 51.0 %    Serum creatinine (03/2022) 1.2 Total cholesterol (07/20230 155 TSH (03/2022) 2.884 PSA (03/2022) 1.28 I have reviewed the labs.   Assessment & Plan:    Testosterone deficiency  - testosterone normal *** 2. Erectile dysfunction - Sildenafil refilled   3. BPH with LU TS - PSA; stable  - DRE; benign  - Will continue to screen in 1 year   Return in about 6 months (around 06/21/2022) for testosterone level only .  Raymond Flowers, [facility] [address] [place], [place] [address] [phone]

## 2022-12-24 ENCOUNTER — Ambulatory Visit: Payer: Medicare HMO | Admitting: Urology

## 2022-12-24 ENCOUNTER — Encounter: Payer: Self-pay | Admitting: Urology

## 2022-12-24 DIAGNOSIS — N401 Enlarged prostate with lower urinary tract symptoms: Secondary | ICD-10-CM

## 2022-12-24 DIAGNOSIS — N529 Male erectile dysfunction, unspecified: Secondary | ICD-10-CM

## 2022-12-24 MED ORDER — SILDENAFIL CITRATE 100 MG PO TABS: ORAL_TABLET | ORAL | 3 refills | Status: DC

## 2022-12-24 MED ORDER — TADALAFIL 5 MG PO TABS: 5.0000 mg | ORAL_TABLET | Freq: Every day | ORAL | 3 refills | Status: DC | PRN

## 2022-12-24 NOTE — Progress Notes (Signed)
12/24/22 8:51 AM   Raymond Flowers [birth year] [id number]  Referring provider:  Rusty Aus, MD [facility] [place],  [place] [address]  Chief Complaint  Patient presents with   Hypogonadism    Urological history  1. Testosterone deficiency - Contributing factors of age - Testosterone (11/2022) 531 - hemoglobin/hematocrit (11/2022) 15.9/46.9  2. ED - contributing factors of age, BPH, HLD and pre-diabetes - managed with tadalafil 5 mg daily and to supplement with the sildenafil 100 mg on days for intercourse  3. BPH with LU TS - PSA (11/2022) 1.1 - prostate volume 50 cc   4. High risk hematuria - Non-smoker - CTU 05/2020 Bilateral nonobstructive nephrolithiasis.   Several benign-appearing left renal cysts.  Mild prostatomegaly.   - Cysto 05/2020 NED - no reports of gross heme - UA (03/2022) negative for micro heme  HPI: Raymond Flowers is a 67 y.o.male who presents today for a 1 year follow-up.  I PSS 8/0  He has nocturia x 3, which he attributes to a lot of fluid intake. Patient denies any modifying or aggravating factors.  Patient denies any gross hematuria, dysuria or suprapubic/flank pain. Patient denies any fevers, chills, nausea or vomiting.     IPSS     Row Name 12/24/22 0800         International Prostate Symptom Score   How often have you had the sensation of not emptying your bladder? Not at All     How often have you had to urinate less than every two hours? Not at All     How often have you found you stopped and started again several times when you urinated? Not at All     How often have you found it difficult to postpone urination? Not at All     How often have you had a weak urinary stream? Not at All     How often have you had to strain to start urination? Not at All     How many times did you typically get up at night to urinate? 3 Times     Total IPSS Score 3       Quality of Life due to  urinary symptoms   If you were to spend the rest of your life with your urinary condition just the way it is now how would you feel about that? Pleased              Score:  1-7 Mild 8-19 Moderate 20-35 Severe  SHIM 21  Patient still having spontaneous erections.  He denies any pain or curvature with erections.     SHIM     Row Name 12/24/22 [id number]         SHIM: Over the last 6 months:   How do you rate your confidence that you could get and keep an erection? Moderate     When you had erections with sexual stimulation, how often were your erections hard enough for penetration (entering your partner)? Almost Always or Always     During sexual intercourse, how often were you able to maintain your erection after you had penetrated (entered) your partner? Most Times (much more than half the time)     During sexual intercourse, how difficult was it to maintain your erection to completion of intercourse? Slightly Difficult     When you attempted sexual intercourse, how often was it satisfactory for you? Almost Always or Always  SHIM Total Score   SHIM 21                PMH: Past Medical History:  Diagnosis Date   Androgen deficiency    Arthritis    shoulders, toe   Benign enlargement of prostate    Blood in semen    BPH (benign prostatic hyperplasia)    Erectile dysfunction    History of kidney stones    Hypertension    Impotence    Skin cancer, basal cell    Resected from Right facial area.     Surgical History: Past Surgical History:  Procedure Laterality Date   KNEE ARTHROSCOPY Right 2016   REVERSE SHOULDER ARTHROPLASTY Right 02/02/2021   Procedure: REVERSE SHOULDER ARTHROPLASTY;  Surgeon: Justice Britain, MD;  Location: [facility];  Service: Orthopedics;  Laterality: Right;  130min   SHOULDER SURGERY  2009   right  shoulder x 2  (20016)   TONSILLECTOMY     tricep surgery Left 2015   VARICOCELE EXCISION      Home Medications:  Allergies as of 12/24/2022        Reactions   Codeine Other (See Comments)   Reaction:  Hallucinations   Oxycodone    Other reaction(s): Hallucination        Medication List        Accurate as of December 24, 2022  8:51 AM. If you have any questions, ask your nurse or doctor.          STOP taking these medications    sildenafil 20 MG tablet Commonly known as: REVATIO Stopped by: Daronte Shostak, PA-C       TAKE these medications    aspirin 81 MG tablet Take 81 mg by mouth daily.   cyanocobalamin 1000 MCG tablet Commonly known as: VITAMIN B12 Take 1,000 mcg by mouth daily.   hydrochlorothiazide 25 MG tablet Commonly known as: HYDRODIURIL Take 25 mg by mouth daily.   OCUVITE ADULT 50+ PO Take 1 tablet by mouth daily.   sildenafil 100 MG tablet Commonly known as: VIAGRA TAKE 1 TABLET BY MOUTH DAILY AS NEEDED FOR ERECTILE DYSFUNCTION - TAKE 2 HOURS PRIOR TO INTERCOURSE ON AN EMPTY STOMACH.   tadalafil 5 MG tablet Commonly known as: CIALIS Take 1 tablet (5 mg total) by mouth daily as needed for erectile dysfunction.        Allergies:  Allergies  Allergen Reactions   Codeine Other (See Comments)    Reaction:  Hallucinations    Oxycodone     Other reaction(s): Hallucination    Family History: Family History  Problem Relation Age of Onset   Heart disease Mother 93   Hypertension Mother    Heart disease Father 72       MI    Kidney Stones Father        mother   Hypertension Brother    Prostate cancer Neg Hx    Kidney disease Neg Hx    Kidney cancer Neg Hx    Bladder Cancer Neg Hx     Social History:  reports that he has never smoked. He has never used smokeless tobacco. He reports current alcohol use of about 2.0 standard drinks of alcohol per week. He reports that he does not use drugs.   Physical Exam: There were no vitals taken for this visit.  Constitutional:  Well nourished. Alert and oriented, No acute distress. HEENT: [place] AT, moist mucus membranes.  Trachea  midline Cardiovascular: No clubbing, cyanosis, or  edema. Respiratory: Normal respiratory effort, no increased work of breathing. GU: No CVA tenderness.  No bladder fullness or masses.  Patient with circumcised phallus.  Urethral meatus is patent.  No penile discharge. No penile lesions or rashes. Scrotum without lesions, cysts, rashes and/or edema.  Testicles are located scrotally bilaterally. No masses are appreciated in the testicles. Left and right epididymis are normal. Rectal: Patient with  normal sphincter tone. Anus and perineum without scarring or rashes. No rectal masses are appreciated. Prostate is approximately 45 grams, no nodules are appreciated. Seminal vesicles could not palpate.   Neurologic: Grossly intact, no focal deficits, moving all 4 extremities. Psychiatric: Normal mood and affect.   Laboratory Data: Results for orders placed or performed in visit on 12/18/22  Testosterone  Result Value Ref Range   Testosterone 531 264 - 916 ng/dL  PSA  Result Value Ref Range   Prostate Specific Ag, Serum 1.1 0.0 - 4.0 ng/mL  Hemoglobin and Hematocrit, Blood  Result Value Ref Range   Hemoglobin 15.9 13.0 - 17.7 g/dL   Hematocrit 46.9 37.5 - 51.0 %    Serum creatinine (03/2022) 1.2 Total cholesterol (07/20230 155 TSH (03/2022) 2.884 PSA (03/2022) 1.28 I have reviewed the labs.   Assessment & Plan:    Testosterone deficiency  - resolved   2. Erectile dysfunction - continue sildenafil 100 mg, on-demand-dosing  3. BPH with LU TS - PSA; stable  - DRE; benign  - Will continue to screen in 1 year   Return in about 1 year (around 12/24/2023) for PSA, testosterone, IPSS, SHIM, PSA and exam.   Zara Council, PA-C   [facility] [address] [place],  [address] [phone]

## 2023-02-26 DIAGNOSIS — M25561 Pain in right knee: Secondary | ICD-10-CM | POA: Diagnosis not present

## 2023-03-18 DIAGNOSIS — M25561 Pain in right knee: Secondary | ICD-10-CM | POA: Diagnosis not present

## 2023-04-01 DIAGNOSIS — H2513 Age-related nuclear cataract, bilateral: Secondary | ICD-10-CM | POA: Diagnosis not present

## 2023-04-01 DIAGNOSIS — H40003 Preglaucoma, unspecified, bilateral: Secondary | ICD-10-CM | POA: Diagnosis not present

## 2023-04-05 DIAGNOSIS — Z79899 Other long term (current) drug therapy: Secondary | ICD-10-CM | POA: Diagnosis not present

## 2023-04-05 DIAGNOSIS — E538 Deficiency of other specified B group vitamins: Secondary | ICD-10-CM | POA: Diagnosis not present

## 2023-04-05 DIAGNOSIS — Z125 Encounter for screening for malignant neoplasm of prostate: Secondary | ICD-10-CM | POA: Diagnosis not present

## 2023-04-23 DIAGNOSIS — Z872 Personal history of diseases of the skin and subcutaneous tissue: Secondary | ICD-10-CM | POA: Diagnosis not present

## 2023-04-23 DIAGNOSIS — G548 Other nerve root and plexus disorders: Secondary | ICD-10-CM | POA: Diagnosis not present

## 2023-04-23 DIAGNOSIS — L570 Actinic keratosis: Secondary | ICD-10-CM | POA: Diagnosis not present

## 2023-04-23 DIAGNOSIS — L578 Other skin changes due to chronic exposure to nonionizing radiation: Secondary | ICD-10-CM | POA: Diagnosis not present

## 2023-04-23 DIAGNOSIS — Z859 Personal history of malignant neoplasm, unspecified: Secondary | ICD-10-CM | POA: Diagnosis not present

## 2023-04-23 DIAGNOSIS — Z85820 Personal history of malignant melanoma of skin: Secondary | ICD-10-CM | POA: Diagnosis not present

## 2023-04-24 DIAGNOSIS — Z125 Encounter for screening for malignant neoplasm of prostate: Secondary | ICD-10-CM | POA: Diagnosis not present

## 2023-04-24 DIAGNOSIS — Z8249 Family history of ischemic heart disease and other diseases of the circulatory system: Secondary | ICD-10-CM | POA: Diagnosis not present

## 2023-04-24 DIAGNOSIS — Z79899 Other long term (current) drug therapy: Secondary | ICD-10-CM | POA: Diagnosis not present

## 2023-04-24 DIAGNOSIS — Z1331 Encounter for screening for depression: Secondary | ICD-10-CM | POA: Diagnosis not present

## 2023-04-24 DIAGNOSIS — Z0000 Encounter for general adult medical examination without abnormal findings: Secondary | ICD-10-CM | POA: Diagnosis not present

## 2023-04-26 ENCOUNTER — Other Ambulatory Visit: Payer: Self-pay | Admitting: Internal Medicine

## 2023-04-26 DIAGNOSIS — Z0000 Encounter for general adult medical examination without abnormal findings: Secondary | ICD-10-CM

## 2023-04-26 DIAGNOSIS — Z8249 Family history of ischemic heart disease and other diseases of the circulatory system: Secondary | ICD-10-CM

## 2023-05-02 ENCOUNTER — Ambulatory Visit
Admission: RE | Admit: 2023-05-02 | Discharge: 2023-05-02 | Disposition: A | Discharge status: Home / Self Care | Payer: Medicare HMO | Source: Ambulatory Visit | Attending: Internal Medicine | Admitting: Internal Medicine

## 2023-05-02 DIAGNOSIS — Z8249 Family history of ischemic heart disease and other diseases of the circulatory system: Secondary | ICD-10-CM | POA: Insufficient documentation

## 2023-05-02 DIAGNOSIS — Z0000 Encounter for general adult medical examination without abnormal findings: Secondary | ICD-10-CM | POA: Insufficient documentation

## 2023-06-05 DIAGNOSIS — M7672 Peroneal tendinitis, left leg: Secondary | ICD-10-CM | POA: Diagnosis not present

## 2023-06-05 DIAGNOSIS — M216X2 Other acquired deformities of left foot: Secondary | ICD-10-CM | POA: Diagnosis not present

## 2023-06-05 DIAGNOSIS — M216X1 Other acquired deformities of right foot: Secondary | ICD-10-CM | POA: Diagnosis not present

## 2023-07-08 DIAGNOSIS — E782 Mixed hyperlipidemia: Secondary | ICD-10-CM | POA: Diagnosis not present

## 2023-08-13 DIAGNOSIS — D044 Carcinoma in situ of skin of scalp and neck: Secondary | ICD-10-CM | POA: Diagnosis not present

## 2023-08-13 DIAGNOSIS — L570 Actinic keratosis: Secondary | ICD-10-CM | POA: Diagnosis not present

## 2023-08-13 DIAGNOSIS — D485 Neoplasm of uncertain behavior of skin: Secondary | ICD-10-CM | POA: Diagnosis not present

## 2023-08-21 DIAGNOSIS — H1131 Conjunctival hemorrhage, right eye: Secondary | ICD-10-CM | POA: Diagnosis not present

## 2023-09-11 DIAGNOSIS — C4442 Squamous cell carcinoma of skin of scalp and neck: Secondary | ICD-10-CM | POA: Diagnosis not present

## 2023-09-11 DIAGNOSIS — D034 Melanoma in situ of scalp and neck: Secondary | ICD-10-CM | POA: Diagnosis not present

## 2023-10-03 DIAGNOSIS — H40003 Preglaucoma, unspecified, bilateral: Secondary | ICD-10-CM | POA: Diagnosis not present

## 2023-10-03 DIAGNOSIS — H2513 Age-related nuclear cataract, bilateral: Secondary | ICD-10-CM | POA: Diagnosis not present

## 2023-10-14 ENCOUNTER — Other Ambulatory Visit: Payer: Self-pay

## 2023-10-14 ENCOUNTER — Emergency Department: Payer: Medicare HMO

## 2023-10-14 ENCOUNTER — Observation Stay
Admission: EM | Admit: 2023-10-14 | Discharge: 2023-10-15 | Disposition: A | Discharge status: Home / Self Care | Payer: Medicare HMO | Attending: Obstetrics and Gynecology | Admitting: Obstetrics and Gynecology

## 2023-10-14 DIAGNOSIS — I10 Essential (primary) hypertension: Secondary | ICD-10-CM | POA: Diagnosis not present

## 2023-10-14 DIAGNOSIS — R0789 Other chest pain: Principal | ICD-10-CM | POA: Diagnosis present

## 2023-10-14 DIAGNOSIS — I160 Hypertensive urgency: Secondary | ICD-10-CM | POA: Diagnosis not present

## 2023-10-14 DIAGNOSIS — E298 Other testicular dysfunction: Secondary | ICD-10-CM | POA: Diagnosis not present

## 2023-10-14 DIAGNOSIS — R202 Paresthesia of skin: Secondary | ICD-10-CM | POA: Diagnosis not present

## 2023-10-14 DIAGNOSIS — Z7982 Long term (current) use of aspirin: Secondary | ICD-10-CM | POA: Insufficient documentation

## 2023-10-14 DIAGNOSIS — R778 Other specified abnormalities of plasma proteins: Secondary | ICD-10-CM | POA: Diagnosis not present

## 2023-10-14 DIAGNOSIS — G459 Transient cerebral ischemic attack, unspecified: Secondary | ICD-10-CM | POA: Diagnosis not present

## 2023-10-14 DIAGNOSIS — E349 Endocrine disorder, unspecified: Secondary | ICD-10-CM | POA: Diagnosis present

## 2023-10-14 DIAGNOSIS — Z96692 Finger-joint replacement of left hand: Secondary | ICD-10-CM | POA: Insufficient documentation

## 2023-10-14 DIAGNOSIS — R079 Chest pain, unspecified: Secondary | ICD-10-CM

## 2023-10-14 DIAGNOSIS — Z87898 Personal history of other specified conditions: Secondary | ICD-10-CM

## 2023-10-14 DIAGNOSIS — R931 Abnormal findings on diagnostic imaging of heart and coronary circulation: Secondary | ICD-10-CM

## 2023-10-14 DIAGNOSIS — I2510 Atherosclerotic heart disease of native coronary artery without angina pectoris: Secondary | ICD-10-CM | POA: Insufficient documentation

## 2023-10-14 DIAGNOSIS — Z79899 Other long term (current) drug therapy: Secondary | ICD-10-CM | POA: Diagnosis not present

## 2023-10-14 DIAGNOSIS — Z8249 Family history of ischemic heart disease and other diseases of the circulatory system: Secondary | ICD-10-CM

## 2023-10-14 DIAGNOSIS — R001 Bradycardia, unspecified: Secondary | ICD-10-CM

## 2023-10-14 DIAGNOSIS — R7989 Other specified abnormal findings of blood chemistry: Secondary | ICD-10-CM | POA: Insufficient documentation

## 2023-10-14 DIAGNOSIS — Z85828 Personal history of other malignant neoplasm of skin: Secondary | ICD-10-CM | POA: Diagnosis not present

## 2023-10-14 DIAGNOSIS — R200 Anesthesia of skin: Secondary | ICD-10-CM | POA: Diagnosis present

## 2023-10-14 LAB — CBC WITH DIFFERENTIAL/PLATELET
Abs Immature Granulocytes: 0.01 10*3/uL (ref 0.00–0.07)
Basophils Absolute: 0.1 10*3/uL (ref 0.0–0.1)
Basophils Relative: 1 %
Eosinophils Absolute: 0.2 10*3/uL (ref 0.0–0.5)
Eosinophils Relative: 2 %
HCT: 42.8 % (ref 39.0–52.0)
Hemoglobin: 15.3 g/dL (ref 13.0–17.0)
Immature Granulocytes: 0 %
Lymphocytes Relative: 41 %
Lymphs Abs: 2.8 10*3/uL (ref 0.7–4.0)
MCH: 34.1 pg — ABNORMAL HIGH (ref 26.0–34.0)
MCHC: 35.7 g/dL (ref 30.0–36.0)
MCV: 95.3 fL (ref 80.0–100.0)
Monocytes Absolute: 0.8 10*3/uL (ref 0.1–1.0)
Monocytes Relative: 11 %
Neutro Abs: 3 10*3/uL (ref 1.7–7.7)
Neutrophils Relative %: 45 %
Platelets: 176 10*3/uL (ref 150–400)
RBC: 4.49 MIL/uL (ref 4.22–5.81)
RDW: 13 % (ref 11.5–15.5)
WBC: 6.7 10*3/uL (ref 4.0–10.5)
nRBC: 0 % (ref 0.0–0.2)

## 2023-10-14 LAB — COMPREHENSIVE METABOLIC PANEL
ALT: 19 U/L (ref 0–44)
AST: 21 U/L (ref 15–41)
Albumin: 4.2 g/dL (ref 3.5–5.0)
Alkaline Phosphatase: 43 U/L (ref 38–126)
Anion gap: 11 (ref 5–15)
BUN: 20 mg/dL (ref 8–23)
CO2: 27 mmol/L (ref 22–32)
Calcium: 9 mg/dL (ref 8.9–10.3)
Chloride: 103 mmol/L (ref 98–111)
Creatinine, Ser: 1.18 mg/dL (ref 0.61–1.24)
GFR, Estimated: >60 mL/min (ref >60)
Glucose, Bld: 103 mg/dL — ABNORMAL HIGH (ref 70–99)
Potassium: 3.4 mmol/L — ABNORMAL LOW (ref 3.5–5.1)
Sodium: 141 mmol/L (ref 135–145)
Total Bilirubin: 0.6 mg/dL (ref 0.0–1.2)
Total Protein: 6.8 g/dL (ref 6.5–8.1)

## 2023-10-14 LAB — URINALYSIS, ROUTINE W REFLEX MICROSCOPIC
Bilirubin Urine: NEGATIVE
Glucose, UA: NEGATIVE mg/dL
Hgb urine dipstick: NEGATIVE
Ketones, ur: NEGATIVE mg/dL
Leukocytes,Ua: NEGATIVE
Nitrite: NEGATIVE
Protein, ur: NEGATIVE mg/dL
Specific Gravity, Urine: 1.019 (ref 1.005–1.030)
pH: 6 (ref 5.0–8.0)

## 2023-10-14 LAB — TROPONIN I (HIGH SENSITIVITY): Troponin I (High Sensitivity): 23 ng/L — ABNORMAL HIGH (ref <18)

## 2023-10-14 MED ORDER — NITROGLYCERIN 2 % TD OINT
1.0000 [in_us] | TOPICAL_OINTMENT | Freq: Once | TRANSDERMAL | Status: AC
Start: 2023-10-15 — End: 2023-10-15
  Administered 2023-10-15: 1 [in_us] via TOPICAL
  Filled 2023-10-14: qty 1

## 2023-10-14 NOTE — ED Provider Notes (Signed)
[facility] Provider Note    Event Date/Time   First MD Initiated Contact with Patient 10/14/23 2311     (approximate)   History   Numbness   HPI {Remember to add pertinent medical, surgical, social, and/or OB history to HPI:1} Raymond Flowers is a 68 y.o. male  ***       Past Medical History   Past Medical History:  Diagnosis Date   Androgen deficiency    Arthritis    shoulders, toe   Benign enlargement of prostate    Blood in semen    BPH (benign prostatic hyperplasia)    Erectile dysfunction    History of kidney stones    Hypertension    Impotence    Skin cancer, basal cell    Resected from Right facial area.      Active Problem List   Patient Active Problem List   Diagnosis Date Noted   Degeneration of lumbar intervertebral disc 03/25/2018   Lumbar adjacent segment disease with spondylolisthesis 03/25/2018   Testosterone deficiency 04/24/2017   B12 deficiency 03/28/2017   Family history of coronary artery disease 03/28/2017   Kidney stones 10/24/2016   BPH with obstruction/lower urinary tract symptoms 04/12/2015   Erectile dysfunction 04/12/2015   Low testosterone 02/26/2014   Family history of early CAD 09/27/2011   Chest pain 09/27/2011   Visit for preventive health examination 09/27/2011     Past Surgical History   Past Surgical History:  Procedure Laterality Date   KNEE ARTHROSCOPY Right 2016   REVERSE SHOULDER ARTHROPLASTY Right 02/02/2021   Procedure: REVERSE SHOULDER ARTHROPLASTY;  Surgeon: Francena Hanly, MD;  Location: [facility];  Service: Orthopedics;  Laterality: Right;    SHOULDER SURGERY  2009   right  shoulder x 2  (20016)   TONSILLECTOMY     tricep surgery Left 2015   VARICOCELE EXCISION       Home Medications   Prior to Admission medications   Medication Sig Start Date End Date Taking? Authorizing Provider  aspirin 81 MG tablet Take 81 mg by mouth daily.    [provider]   hydrochlorothiazide (HYDRODIURIL) 25 MG tablet Take 25 mg by mouth daily.  03/02/15   [provider]  Multiple Vitamins-Minerals (OCUVITE ADULT 50+ PO) Take 1 tablet by mouth daily.    [provider]  sildenafil (VIAGRA) 100 MG tablet TAKE 1 TABLET BY MOUTH DAILY AS NEEDED FOR ERECTILE DYSFUNCTION - TAKE 2 HOURS PRIOR TO INTERCOURSE ON AN EMPTY STOMACH. 12/24/22   McGowan, Carollee Herter A, PA-C  tadalafil (CIALIS) 5 MG tablet Take 1 tablet (5 mg total) by mouth daily as needed for erectile dysfunction. 12/24/22   Michiel Cowboy A, PA-C  vitamin B-12 (CYANOCOBALAMIN) 1000 MCG tablet Take 1,000 mcg by mouth daily.    [provider]     Allergies  Codeine and Oxycodone   Family History   Family History  Problem Relation Age of Onset   Heart disease Mother 85   Hypertension Mother    Heart disease Father 45       MI    Kidney Stones Father        mother   Hypertension Brother    Prostate cancer Neg Hx    Kidney disease Neg Hx    Kidney cancer Neg Hx    Bladder Cancer Neg Hx      Physical Exam  Triage Vital Signs: ED Triage Vitals  Encounter Vitals Group  BP 10/14/23 2003 (!) 175/79     Systolic BP Percentile --      Diastolic BP Percentile --      Pulse Rate 10/14/23 2003 67     Resp 10/14/23 2003 18     Temp 10/14/23 2012 98.2 F (36.8 C)     Temp Source 10/14/23 2012 Oral     SpO2 10/14/23 2003 96 %     Weight 10/14/23 2004 190 lb (86.2 kg)     Height 10/14/23 2004 5\' 10"  (1.778 m)     Head Circumference --      Peak Flow --      Pain Score 10/14/23 2003 2     Pain Loc --      Pain Education --      Exclude from Growth Chart --     Updated Vital Signs: BP (!) 175/79 (BP Location: Right Arm)   Pulse 67   Temp 98.2 F (36.8 C) (Oral)   Resp 18   Ht 5\' 10"  (1.778 m)   Wt 86.2 kg   SpO2 96%   BMI 27.26 kg/m   {Only need to document appropriate and relevant physical exam:1} General: Awake, no distress. *** CV:  Good peripheral  perfusion. *** Resp:  Normal effort. *** Abd:  No distention. *** Other:  ***   ED Results / Procedures / Treatments  Labs (all labs ordered are listed, but only abnormal results are displayed) Labs Reviewed  CBC WITH DIFFERENTIAL/PLATELET - Abnormal; Notable for the following components:      Result Value   MCH 34.1 (*)    All other components within normal limits  COMPREHENSIVE METABOLIC PANEL - Abnormal; Notable for the following components:   Potassium 3.4 (*)    Glucose, Bld 103 (*)    All other components within normal limits  URINALYSIS, ROUTINE W REFLEX MICROSCOPIC - Abnormal; Notable for the following components:   Color, Urine YELLOW (*)    APPearance CLEAR (*)    All other components within normal limits  TROPONIN I (HIGH SENSITIVITY) - Abnormal; Notable for the following components:   Troponin I (High Sensitivity) 23 (*)    All other components within normal limits  TROPONIN I (HIGH SENSITIVITY)     EKG  ***   RADIOLOGY *** {You MUST document your own interpretation of imaging, as well as the fact that you reviewed the radiologist's report!:1}  Official radiology report(s): DG Chest Portable 1 View Result Date: 10/14/2023 CLINICAL DATA:  Left arm tingling.  Hypertension EXAM: PORTABLE CHEST 1 VIEW COMPARISON:  08/29/2011 FINDINGS: The heart size and mediastinal contours are within normal limits. Both lungs are clear. The visualized skeletal structures are unremarkable. IMPRESSION: No active disease. Electronically Signed   By: Charlett Nose M.D.   On: 10/14/2023 20:25     PROCEDURES:  Critical Care performed: {CriticalCareYesNo:19197::"Yes, see critical care procedure note(s)","No"}  Procedures   MEDICATIONS ORDERED IN ED: Medications - No data to display   IMPRESSION / MDM / ASSESSMENT AND PLAN / ED COURSE  I reviewed the triage vital signs and the nursing notes.                              Differential diagnosis includes, but is not limited  to, ***  Patient's presentation is most consistent with {EM COPA:27473}  {If the patient is on the monitor, remove the brackets and asterisks on the sentence below and remember to  document it as a Procedure as well. Otherwise delete the sentence below:1} {**The patient is on the cardiac monitor to evaluate for evidence of arrhythmia and/or significant heart rate changes.**}  {Remember to include, when applicable, any/all of the following data: independent review of imaging independent review of labs (comment specifically on pertinent positives and negatives) review of specific prior hospitalizations, PCP/specialist notes, etc. discuss meds given and prescribed document any discussion with consultants (including hospitalists) any clinical decision tools you used and why (PECARN, NEXUS, etc.) did you consider admitting the patient? document social determinants of health affecting patient's care (homelessness, inability to follow up in a timely fashion, etc) document any pre-existing conditions increasing risk on current visit (e.g. diabetes and HTN increasing danger of high-risk chest pain/ACS) describes what meds you gave (especially parenteral) and why any other interventions?:1}      FINAL CLINICAL IMPRESSION(S) / ED DIAGNOSES   Final diagnoses:  None     Rx / DC Orders   ED Discharge Orders     None        Note:  This document was prepared using Dragon voice recognition software and may include unintentional dictation errors.

## 2023-10-14 NOTE — ED Triage Notes (Signed)
Pt arrives via POV with CC of L arm tingling that started today at approx 1500. Denies any CP or SOB.

## 2023-10-15 ENCOUNTER — Observation Stay
Admit: 2023-10-15 | Discharge: 2023-10-15 | Disposition: A | Discharge status: Home / Self Care | Payer: Medicare HMO | Attending: Internal Medicine

## 2023-10-15 ENCOUNTER — Emergency Department: Payer: Medicare HMO

## 2023-10-15 ENCOUNTER — Observation Stay (HOSPITAL_BASED_OUTPATIENT_CLINIC_OR_DEPARTMENT_OTHER)
Admit: 2023-10-15 | Discharge: 2023-10-15 | Disposition: A | Discharge status: Home / Self Care | Payer: Medicare HMO | Attending: Internal Medicine | Admitting: Internal Medicine

## 2023-10-15 ENCOUNTER — Observation Stay: Payer: Medicare HMO

## 2023-10-15 DIAGNOSIS — R0789 Other chest pain: Secondary | ICD-10-CM

## 2023-10-15 DIAGNOSIS — I160 Hypertensive urgency: Secondary | ICD-10-CM | POA: Diagnosis not present

## 2023-10-15 DIAGNOSIS — R202 Paresthesia of skin: Secondary | ICD-10-CM | POA: Diagnosis not present

## 2023-10-15 DIAGNOSIS — R931 Abnormal findings on diagnostic imaging of heart and coronary circulation: Secondary | ICD-10-CM

## 2023-10-15 DIAGNOSIS — R29898 Other symptoms and signs involving the musculoskeletal system: Secondary | ICD-10-CM | POA: Diagnosis not present

## 2023-10-15 DIAGNOSIS — G459 Transient cerebral ischemic attack, unspecified: Secondary | ICD-10-CM | POA: Diagnosis not present

## 2023-10-15 DIAGNOSIS — R001 Bradycardia, unspecified: Secondary | ICD-10-CM

## 2023-10-15 DIAGNOSIS — I10 Essential (primary) hypertension: Secondary | ICD-10-CM | POA: Insufficient documentation

## 2023-10-15 DIAGNOSIS — Z87898 Personal history of other specified conditions: Secondary | ICD-10-CM

## 2023-10-15 LAB — LIPID PANEL
Cholesterol: 98 mg/dL (ref 0–200)
HDL: 35 mg/dL — ABNORMAL LOW (ref >40)
LDL Cholesterol: 53 mg/dL (ref 0–99)
Total CHOL/HDL Ratio: 2.8 {ratio}
Triglycerides: 50 mg/dL (ref <150)
VLDL: 10 mg/dL (ref 0–40)

## 2023-10-15 LAB — ECHOCARDIOGRAM COMPLETE
AR max vel: 1.82 cm2
AV Area VTI: 1.96 cm2
AV Area mean vel: 1.85 cm2
AV Mean grad: 7 mm[Hg]
AV Peak grad: 18 mm[Hg]
Ao pk vel: 2.12 m/s
Area-P 1/2: 2.56 cm2
Height: 70 in
MV VTI: 2.41 cm2
S' Lateral: 2.6 cm
Weight: 3040 [oz_av]

## 2023-10-15 LAB — CBC
HCT: 41.2 % (ref 39.0–52.0)
Hemoglobin: 14.7 g/dL (ref 13.0–17.0)
MCH: 33.7 pg (ref 26.0–34.0)
MCHC: 35.7 g/dL (ref 30.0–36.0)
MCV: 94.5 fL (ref 80.0–100.0)
Platelets: 160 10*3/uL (ref 150–400)
RBC: 4.36 MIL/uL (ref 4.22–5.81)
RDW: 12.9 % (ref 11.5–15.5)
WBC: 6.4 10*3/uL (ref 4.0–10.5)
nRBC: 0 % (ref 0.0–0.2)

## 2023-10-15 LAB — NM MYOCAR MULTI W/SPECT W/WALL MOTION / EF
Angina Index: 0
Duke Treadmill Score: 5
Estimated workload: 11.7
Exercise duration (min): 10 min
Exercise duration (sec): 2 s
LV dias vol: 76 mL (ref 62–150)
LV sys vol: 22 mL
MPHR: 152 {beats}/min
Nuc Stress EF: 71 %
Peak HR: 173 {beats}/min
Percent HR: 113 %
Rest HR: 64 {beats}/min
Rest Nuclear Isotope Dose: 9.4 mCi
SDS: 0
SRS: 0
SSS: 0
ST Depression (mm): 1 mm
Stress Nuclear Isotope Dose: 30.9 mCi
TID: 0.82

## 2023-10-15 LAB — TROPONIN I (HIGH SENSITIVITY): Troponin I (High Sensitivity): 24 ng/L — ABNORMAL HIGH (ref <18)

## 2023-10-15 LAB — HEMOGLOBIN A1C
Hgb A1c MFr Bld: 5.4 % (ref 4.8–5.6)
Mean Plasma Glucose: 108.28 mg/dL

## 2023-10-15 LAB — CBG MONITORING, ED: Glucose-Capillary: 133 mg/dL — ABNORMAL HIGH (ref 70–99)

## 2023-10-15 LAB — HIV ANTIBODY (ROUTINE TESTING W REFLEX): HIV Screen 4th Generation wRfx: NONREACTIVE

## 2023-10-15 MED ORDER — ASPIRIN 81 MG PO TBEC: 81.0000 mg | DELAYED_RELEASE_TABLET | Freq: Every day | ORAL | Status: DC

## 2023-10-15 MED ORDER — STROKE: EARLY STAGES OF RECOVERY BOOK: Freq: Once | Status: DC

## 2023-10-15 MED ORDER — TECHNETIUM TC 99M TETROFOSMIN IV KIT
10.0000 | PACK | Freq: Once | INTRAVENOUS | Status: AC | PRN
  Administered 2023-10-15: 9.44 via INTRAVENOUS

## 2023-10-15 MED ORDER — ACETAMINOPHEN 325 MG PO TABS: 650.0000 mg | ORAL_TABLET | ORAL | Status: DC | PRN

## 2023-10-15 MED ORDER — NITROGLYCERIN 0.4 MG SL SUBL: 0.4000 mg | SUBLINGUAL_TABLET | SUBLINGUAL | Status: DC | PRN

## 2023-10-15 MED ORDER — ONDANSETRON HCL 4 MG/2ML IJ SOLN: 4.0000 mg | Freq: Four times a day (QID) | INTRAMUSCULAR | Status: DC | PRN

## 2023-10-15 MED ORDER — ENOXAPARIN SODIUM 40 MG/0.4ML IJ SOSY
40.0000 mg | PREFILLED_SYRINGE | INTRAMUSCULAR | Status: DC
  Administered 2023-10-15: 40 mg via SUBCUTANEOUS
  Filled 2023-10-15: qty 0.4

## 2023-10-15 MED ORDER — ASPIRIN 81 MG PO CHEW
324.0000 mg | CHEWABLE_TABLET | Freq: Once | ORAL | Status: AC
  Administered 2023-10-15: 234 mg via ORAL
  Filled 2023-10-15: qty 4

## 2023-10-15 MED ORDER — LORAZEPAM 2 MG/ML IJ SOLN
1.0000 mg | Freq: Once | INTRAMUSCULAR | Status: AC | PRN
  Administered 2023-10-15: 1 mg via INTRAVENOUS
  Filled 2023-10-15: qty 1

## 2023-10-15 MED ORDER — ASPIRIN 81 MG PO TABS: 81.0000 mg | ORAL_TABLET | Freq: Every day | ORAL | Status: DC

## 2023-10-15 MED ORDER — TECHNETIUM TC 99M TETROFOSMIN IV KIT
30.0000 | PACK | Freq: Once | INTRAVENOUS | Status: AC | PRN
  Administered 2023-10-15: 30.85 via INTRAVENOUS

## 2023-10-15 NOTE — Assessment & Plan Note (Addendum)
Several hours of tingling left arm now resolved.  Question anginal equivalent versus TIA Head CT negative Continuous cardiac monitoring for arrhythmias, echocardiogram to evaluate for thromboembolic phenomenon Neuro checks q4 hrs x 24 hrs and then per shift Will allow permissive hypertension

## 2023-10-15 NOTE — ED Notes (Signed)
Pt roomed in 35, CCM in place, bed low and locked, call bell within reach

## 2023-10-15 NOTE — Discharge Summary (Signed)
Raymond Flowers OZH:[id number] DOB: 05-06-1956 DOA: 10/14/2023  PCP: Danella Penton, MD  Admit date: 10/14/2023 Discharge date: 10/15/2023  Time spent: 35 minutes  Recommendations for Outpatient Follow-up:  Pcp and cardiologyf /u     Discharge Diagnoses:  Principal Problem:   Atypical chest pain Active Problems:   Agatston coronary artery calcium score between 200 and 399   Numbness and tingling left arm with complete resolution   Hypertensive urgency   Sinus bradycardia   Family history of early CAD   Testosterone deficiency   Numbness and tingling in left arm   Essential hypertension   Discharge Condition: stable  Diet recommendation: heart healthy  Filed Weights   10/14/23 2004  Weight: 86.2 kg    History of present illness:  From admission h and p Raymond Flowers is a 68 y.o. male with medical history significant for HTN, HLD and family history of CAD in multiple first-degree relatives (not premature) who presents to the ED with tingling and weakness in the left arm extending from the shoulder to just below the elbow subsequently developing upper chest discomfort which she describes as a funny feeling in the chest.  Symptoms started while driving from a hunting trip 3 hours away.  He said the weakness and tingling in his left arm did not seem to improve with changing position of the arm.  He had no associated shortness of breath, nausea vomiting or diaphoresis, visual disturbance, headache or slurred speech or weakness or tingling in the other extremities.  Has had shoulder replacement right shoulder and problems with left elbow in the past.  On his arrival home he took an aspirin and presented to the hospital by which time his symptoms had resolved.  Of note, patient had a coronary CTA in August 2024 because of his strong family history and had a coronary calcium score 292.  He was placed on Crestor at that time with improvement in his cholesterol since.Marland Kitchen  Has no  smoking history.   Hospital Course:  Patient presents with non-exertional pain left chest radiating down left arm, also some mild left arm weakness/numbness. Resolved after several hours, is back to baseline. Patient was evaluated with TTE and nuclear stress test given mild troponin elevation and presence of risk factors - no significant findings on TTE and stress test is low risk. Patient will continue his home asa and statin and f/u with cardiology in about a month. For the left arm pain/weakness patient was evaluated with CT which was negative, Dr. Jerrell Belfast of neurology (not formally consulted but notified by nursing staff of the complaint of weakness) advised MRI, which did not show stroke or other acute or other explanation for the transient weakness. Cervical radiculopathy is one possible explanation. Patient can follow that up with PCP.  Procedures: none   Consultations: cardiology  Discharge Exam: Vitals:   10/15/23 1450 10/15/23 1600  BP: (!) 101/55 (!) 102/57  Pulse: 68 65  Resp: 16 14  Temp:    SpO2: 97% 94%    General: NAD Cardiovascular: RRR Respiratory: CTAB  Discharge Instructions   Discharge Instructions     Diet - low sodium heart healthy   Complete by: As directed    Increase activity slowly   Complete by: As directed       Allergies as of 10/15/2023       Reactions   Codeine Other (See Comments)   Reaction:  Hallucinations   Oxycodone    Other reaction(s): Hallucination  Medication List     STOP taking these medications    sildenafil 100 MG tablet Commonly known as: VIAGRA       TAKE these medications    aspirin 81 MG tablet Take 81 mg by mouth daily.   cyanocobalamin 1000 MCG tablet Commonly known as: VITAMIN B12 Take 1,000 mcg by mouth daily.   hydrochlorothiazide 25 MG tablet Commonly known as: HYDRODIURIL Take 25 mg by mouth daily.   OCUVITE ADULT 50+ PO Take 1 tablet by mouth daily.   rosuvastatin 10 MG  tablet Commonly known as: CRESTOR Take 10 mg by mouth at bedtime.   tadalafil 5 MG tablet Commonly known as: CIALIS Take 1 tablet (5 mg total) by mouth daily as needed for erectile dysfunction.       Allergies  Allergen Reactions   Codeine Other (See Comments)    Reaction:  Hallucinations    Oxycodone     Other reaction(s): Hallucination    Follow-up Information     Danella Penton, MD Follow up.   Specialty: Internal Medicine Contact information: [address] [place] [address] [phone]         Yvonne Kendall, MD Follow up.   Specialty: Cardiology Contact information: [address] [place] [address] [phone]                  The results of significant diagnostics from this hospitalization (including imaging, microbiology, ancillary and laboratory) are listed below for reference.    Significant Diagnostic Studies: NM Myocar Multi W/Spect W/Wall Motion / EF Result Date: 10/15/2023   Normal exercise myocardial perfusion stress test without evidence of significant ischemia or scar.   Left ventricular systolic function is normal.   Coronary artery calcification is noted on the attenuation correction CT.   1 mm horizontal ST depressions noted in the inferolateral leads during stress, non-specific in the setting of normal myocardial perfusion.   This is a low-risk study.   ECHOCARDIOGRAM COMPLETE Result Date: 10/15/2023    ECHOCARDIOGRAM REPORT   Patient Name:   Raymond Flowers Date of Exam: 10/15/2023 Medical Rec #:  [id number]           Height:       70.0 in Accession #:    [id number]          Weight:       190.0 lb Date of Birth:  Mar 08, 1956           BSA:          2.042 m Patient Age:    67 years            BP:           141/76 mmHg Patient Gender: M                   HR:           54 bpm. Exam Location:  [facility] Procedure: 2D Echo, Cardiac Doppler, Color Doppler and Strain Analysis Indications:     TIA  History:          Patient has no prior history of Echocardiogram examinations.                  TIA, Arrythmias:Bradycardia, Signs/Symptoms:Chest Pain; Risk                  Factors:Hypertension.  Sonographer:     Mikki Harbor Referring Phys:  [id number] Andris Baumann Diagnosing  Phys: Yvonne Kendall MD  Sonographer Comments: Global longitudinal strain was attempted. IMPRESSIONS  1. Left ventricular ejection fraction, by estimation, is 60 to 65%. The left ventricle has normal function. The left ventricle has no regional wall motion abnormalities. There is mild left ventricular hypertrophy. Left ventricular diastolic parameters are consistent with Grade I diastolic dysfunction (impaired relaxation). The average left ventricular global longitudinal strain is -19.6 %. The global longitudinal strain is normal.  2. Right ventricular systolic function is normal. The right ventricular size is normal. There is normal pulmonary artery systolic pressure.  3. The mitral valve is normal in structure. Trivial mitral valve regurgitation. No evidence of mitral stenosis.  4. The aortic valve is tricuspid. There is mild thickening of the aortic valve. Aortic valve regurgitation is trivial. Aortic valve sclerosis is present, with no evidence of aortic valve stenosis.  5. The inferior vena cava is normal in size with greater than 50% respiratory variability, suggesting right atrial pressure of 3 mmHg. FINDINGS  Left Ventricle: Left ventricular ejection fraction, by estimation, is 60 to 65%. The left ventricle has normal function. The left ventricle has no regional wall motion abnormalities. The average left ventricular global longitudinal strain is -19.6 %. The global longitudinal strain is normal. The left ventricular internal cavity size was normal in size. There is mild left ventricular hypertrophy. Left ventricular diastolic parameters are consistent with Grade I diastolic dysfunction (impaired relaxation). Right Ventricle: The right ventricular  size is normal. No increase in right ventricular wall thickness. Right ventricular systolic function is normal. There is normal pulmonary artery systolic pressure. The tricuspid regurgitant velocity is 1.95 m/s, and  with an assumed right atrial pressure of 3 mmHg, the estimated right ventricular systolic pressure is 18.2 mmHg. Left Atrium: Left atrial size was normal in size. Right Atrium: Right atrial size was normal in size. Pericardium: There is no evidence of pericardial effusion. Mitral Valve: The mitral valve is normal in structure. Trivial mitral valve regurgitation. No evidence of mitral valve stenosis. MV peak gradient, 3.4 mmHg. The mean mitral valve gradient is 1.0 mmHg. Tricuspid Valve: The tricuspid valve is normal in structure. Tricuspid valve regurgitation is mild. Aortic Valve: The aortic valve is tricuspid. There is mild thickening of the aortic valve. There is mild aortic valve annular calcification. Aortic valve regurgitation is trivial. Aortic valve sclerosis is present, with no evidence of aortic valve stenosis. Aortic valve mean gradient measures 7.0 mmHg. Aortic valve peak gradient measures 18.0 mmHg. Aortic valve area, by VTI measures 1.96 cm. Pulmonic Valve: The pulmonic valve was normal in structure. Pulmonic valve regurgitation is trivial. No evidence of pulmonic stenosis. Aorta: The aortic root and ascending aorta are structurally normal, with no evidence of dilitation. Pulmonary Artery: The pulmonary artery is not well seen. Venous: The inferior vena cava is normal in size with greater than 50% respiratory variability, suggesting right atrial pressure of 3 mmHg. IAS/Shunts: No atrial level shunt detected by color flow Doppler.  LEFT VENTRICLE PLAX 2D LVIDd:         5.10 cm   Diastology LVIDs:         2.60 cm   LV e' medial:    5.66 cm/s LV PW:         1.13 cm   LV E/e' medial:  12.4 LV IVS:        1.19 cm   LV e' lateral:   8.05 cm/s LVOT diam:     2.00 cm   LV E/e' lateral: 8.7  LV SV:          84 LV SV Index:   41        2D Longitudinal Strain LVOT Area:     3.14 cm  2D Strain GLS Avg:     -19.6 %  RIGHT VENTRICLE RV Basal diam:  4.00 cm RV Mid diam:    3.60 cm RV S prime:     11.90 cm/s TAPSE (M-mode): 2.6 cm LEFT ATRIUM             Index        RIGHT ATRIUM           Index LA diam:        4.30 cm 2.11 cm/m   RA Area:     18.80 cm LA Vol (A2C):   66.8 ml 32.71 ml/m  RA Volume:   49.80 ml  24.38 ml/m LA Vol (A4C):   53.1 ml 26.00 ml/m LA Biplane Vol: 63.2 ml 30.95 ml/m  AORTIC VALVE                     PULMONIC VALVE AV Area (Vmax):    1.82 cm      PV Vmax:       1.18 m/s AV Area (Vmean):   1.85 cm      PV Peak grad:  5.6 mmHg AV Area (VTI):     1.96 cm AV Vmax:           212.00 cm/s AV Vmean:          119.000 cm/s AV VTI:            0.427 m AV Peak Grad:      18.0 mmHg AV Mean Grad:      7.0 mmHg LVOT Vmax:         123.00 cm/s LVOT Vmean:        70.200 cm/s LVOT VTI:          0.266 m LVOT/AV VTI ratio: 0.62  AORTA Ao Root diam: 3.60 cm Ao Asc diam:  3.40 cm MITRAL VALVE               TRICUSPID VALVE MV Area (PHT): 2.56 cm    TR Peak grad:   15.2 mmHg MV Area VTI:   2.41 cm    TR Vmax:        195.00 cm/s MV Peak grad:  3.4 mmHg MV Mean grad:  1.0 mmHg    SHUNTS MV Vmax:       0.92 m/s    Systemic VTI:  0.27 m MV Vmean:      44.0 cm/s   Systemic Diam: 2.00 cm MV Decel Time: 296 msec MV E velocity: 70.30 cm/s MV A velocity: 81.80 cm/s MV E/A ratio:  0.86 Cristal Deer End MD Electronically signed by Yvonne Kendall MD Signature Date/Time: 10/15/2023/1:02:52 PM    Final    MR BRAIN WO CONTRAST Result Date: 10/15/2023 CLINICAL DATA:  Provided history: Left arm weakness. Additional history provided: Tingling in left arm. EXAM: MRI HEAD WITHOUT CONTRAST TECHNIQUE: Multiplanar, multiecho pulse sequences of the brain and surrounding structures were obtained without intravenous contrast. COMPARISON:  Head CT 09/25/2023. FINDINGS: Brain: No age-advanced or lobar predominant parenchymal atrophy.  Minimal multifocal T2 FLAIR hyperintense signal abnormality within the cerebral white matter, nonspecific but most often secondary to chronic small vessel ischemia. There is no acute infarct. No evidence of an intracranial mass. No chronic intracranial blood products. No  extra-axial fluid collection. No midline shift. Vascular: Maintained flow voids within the proximal large arterial vessels. Skull and upper cervical spine: No focal worrisome marrow lesion. Sinuses/Orbits: No mass or acute finding within the imaged orbits. Minimal mucosal thickening within the right maxillary sinus. IMPRESSION: 1. No evidence of an acute intracranial abnormality. 2. Minimal multifocal T2 FLAIR hyperintense signal abnormality within the cerebral white matter, nonspecific but most often secondary to chronic small vessel ischemia. 3. Minor right maxillary sinus mucosal thickening. Electronically Signed   By: Jackey Loge D.O.   On: 10/15/2023 11:14   CT Head Wo Contrast Result Date: 10/15/2023 CLINICAL DATA:  TIA.  Left arm tingling EXAM: CT HEAD WITHOUT CONTRAST TECHNIQUE: Contiguous axial images were obtained from the base of the skull through the vertex without intravenous contrast. RADIATION DOSE REDUCTION: This exam was performed according to the departmental dose-optimization program which includes automated exposure control, adjustment of the mA and/or kV according to patient size and/or use of iterative reconstruction technique. COMPARISON:  None Available. FINDINGS: Brain: No acute intracranial abnormality. Specifically, no hemorrhage, hydrocephalus, mass lesion, acute infarction, or significant intracranial injury. Vascular: No hyperdense vessel or unexpected calcification. Skull: No acute calvarial abnormality. Sinuses/Orbits: No acute findings Other: None IMPRESSION: No acute intracranial abnormality. Electronically Signed   By: Charlett Nose M.D.   On: 10/15/2023 01:16   DG Chest Portable 1 View Result Date:  10/14/2023 CLINICAL DATA:  Left arm tingling.  Hypertension EXAM: PORTABLE CHEST 1 VIEW COMPARISON:  08/29/2011 FINDINGS: The heart size and mediastinal contours are within normal limits. Both lungs are clear. The visualized skeletal structures are unremarkable. IMPRESSION: No active disease. Electronically Signed   By: Charlett Nose M.D.   On: 10/14/2023 20:25    Microbiology: No results found for this or any previous visit (from the past 240 hours).   Labs: Basic Metabolic Panel: Recent Labs  Lab 10/14/23 2011  NA 141  K 3.4*  CL 103  CO2 27  GLUCOSE 103*  BUN 20  CREATININE 1.18  CALCIUM 9.0   Liver Function Tests: Recent Labs  Lab 10/14/23 2011  AST 21  ALT 19  ALKPHOS 43  BILITOT 0.6  PROT 6.8  ALBUMIN 4.2   No results for input(s): "LIPASE", "AMYLASE" in the last 168 hours. No results for input(s): "AMMONIA" in the last 168 hours. CBC: Recent Labs  Lab 10/14/23 2011 10/15/23 0248  WBC 6.7 6.4  NEUTROABS 3.0  --   HGB 15.3 14.7  HCT 42.8 41.2  MCV 95.3 94.5  PLT 176 160   Cardiac Enzymes: No results for input(s): "CKTOTAL", "CKMB", "CKMBINDEX", "TROPONINI" in the last 168 hours. BNP: BNP (last 3 results) No results for input(s): "BNP" in the last 8760 hours.  ProBNP (last 3 results) No results for input(s): "PROBNP" in the last 8760 hours.  CBG: Recent Labs  Lab 10/15/23 0741  GLUCAP 133*       Signed:  Silvano Bilis MD.  [facility] 10/15/2023, 4:45 PM

## 2023-10-15 NOTE — ED Notes (Signed)
Echo at bedside

## 2023-10-15 NOTE — Hospital Course (Signed)
 Marland Kitchen

## 2023-10-15 NOTE — Assessment & Plan Note (Addendum)
History of elevated coronary calcium score-292, 04/2023 Family history of CAD Hypertensive urgency Pain seems atypical but patient has family history of known premature CAD and multiple first-degree relatives and coronary calcium score of 292 Troponin slightly elevated but flat Chest pain possibly related to hypertensive urgency Monitor overnight Continue aspirin Cardiology consult to advise on need for further risk stratification

## 2023-10-15 NOTE — Progress Notes (Addendum)
SLP Cancellation Note  Patient Details Name: Raymond Flowers MRN: [id number] DOB: [birth year]   Cancelled treatment:       Reason Eval/Treat Not Completed: SLP screened, no needs identified, will sign off (chart reviewed; consulted NSG then met w/ pt/Wife in room.)  Pt denied any difficulty swallowing and is currently sips w/ meds per testing today. He tolerates swallowing pills w/ water per NSG/pt.  Pt conversed easily in conversation w/out expressive/receptive deficits noted; pt denied any speech-language deficits. Followed instructions as he positioned for testing. Speech clear. Great sense of humor! No further skilled ST services indicated as pt appears at his baseline. Pt agreed. NSG to reconsult if any change in status while admitted.     Jerilynn Som, MS, CCC-SLP Speech Language Pathologist Rehab Services; [name] [phone] (ascom) Claude Waldman 10/15/2023, 11:38 AM

## 2023-10-15 NOTE — ED Notes (Signed)
Pt gone to nuclear stress test

## 2023-10-15 NOTE — Assessment & Plan Note (Signed)
Heart rate in the 50s Continue to monitor

## 2023-10-15 NOTE — Progress Notes (Signed)
Patient ambulated to bathroom  independently. Denies any chest pain this morning.

## 2023-10-15 NOTE — H&P (Signed)
History and Physical    Patient: Raymond Flowers NWG:[id number] DOB: 08/09/1956 DOA: 10/14/2023 DOS: the patient was seen and examined on 10/15/2023 PCP: Danella Penton, MD  Patient coming from: Home  Chief Complaint:  Chief Complaint  Patient presents with   Numbness    HPI: Raymond Flowers is a 68 y.o. male with medical history significant for HTN, HLD and family history of CAD in multiple first-degree relatives (not premature) who presents to the ED with tingling and weakness in the left arm extending from the shoulder to just below the elbow subsequently developing upper chest discomfort which she describes as a funny feeling in the chest.  Symptoms started while driving from a hunting trip 3 hours away.  He said the weakness and tingling in his left arm did not seem to improve with changing position of the arm.  He had no associated shortness of breath, nausea vomiting or diaphoresis, visual disturbance, headache or slurred speech or weakness or tingling in the other extremities.  Has had shoulder replacement right shoulder and problems with left elbow in the past.  On his arrival home he took an aspirin and presented to the hospital by which time his symptoms had resolved.  Of note, patient had a coronary CTA in August 2024 because of his strong family history and had a coronary calcium score 292.  He was placed on Crestor at that time with improvement in his cholesterol since.Marland Kitchen  Has no smoking history.   ED course and data review: BP 175/79 on arrival with pulse in the 50s. Workup notable for troponin 23-24 CBC and CMP unremarkable Urinalysis unremarkable EKG, personally viewed and interpreted shows sinus bradycardia at 54 with no acute ST-T wave changes. Chest x-ray clear and head CT nonacute Patient was given chewable aspirin and placed on [facility] consulted for admission for high risk chest pain   Review of Systems: As mentioned in the history of present  illness. All other systems reviewed and are negative.  Past Medical History:  Diagnosis Date   Androgen deficiency    Arthritis    shoulders, toe   Benign enlargement of prostate    Blood in semen    BPH (benign prostatic hyperplasia)    Erectile dysfunction    History of kidney stones    Hypertension    Impotence    Skin cancer, basal cell    Resected from Right facial area.    Past Surgical History:  Procedure Laterality Date   KNEE ARTHROSCOPY Right 2016   REVERSE SHOULDER ARTHROPLASTY Right 02/02/2021   Procedure: REVERSE SHOULDER ARTHROPLASTY;  Surgeon: Francena Hanly, MD;  Location: [facility];  Service: Orthopedics;  Laterality: Right;    SHOULDER SURGERY  2009   right  shoulder x 2  (20016)   TONSILLECTOMY     tricep surgery Left 2015   VARICOCELE EXCISION     Social History:  reports that he has never smoked. He has never used smokeless tobacco. He reports current alcohol use of about 2.0 standard drinks of alcohol per week. He reports that he does not use drugs.  Allergies  Allergen Reactions   Codeine Other (See Comments)    Reaction:  Hallucinations    Oxycodone     Other reaction(s): Hallucination    Family History  Problem Relation Age of Onset   Heart disease Mother 56   Hypertension Mother    Heart disease Father 59       MI  Kidney Stones Father        mother   Hypertension Brother    Prostate cancer Neg Hx    Kidney disease Neg Hx    Kidney cancer Neg Hx    Bladder Cancer Neg Hx     Prior to Admission medications   Medication Sig Start Date End Date Taking? Authorizing Provider  aspirin 81 MG tablet Take 81 mg by mouth daily.    [provider]  hydrochlorothiazide (HYDRODIURIL) 25 MG tablet Take 25 mg by mouth daily.  03/02/15   [provider]  Multiple Vitamins-Minerals (OCUVITE ADULT 50+ PO) Take 1 tablet by mouth daily.    [provider]  sildenafil (VIAGRA) 100 MG tablet TAKE 1 TABLET BY MOUTH DAILY AS  NEEDED FOR ERECTILE DYSFUNCTION - TAKE 2 HOURS PRIOR TO INTERCOURSE ON AN EMPTY STOMACH. 12/24/22   McGowan, Carollee Herter A, PA-C  tadalafil (CIALIS) 5 MG tablet Take 1 tablet (5 mg total) by mouth daily as needed for erectile dysfunction. 12/24/22   Michiel Cowboy A, PA-C  vitamin B-12 (CYANOCOBALAMIN) 1000 MCG tablet Take 1,000 mcg by mouth daily.    [provider]    Physical Exam: Vitals:   10/14/23 2012 10/15/23 0026 10/15/23 0027 10/15/23 0100  BP:    139/77  Pulse:  (!) 54  (!) 52  Resp:  12  14  Temp: 98.2 F (36.8 C)  98.2 F (36.8 C)   TempSrc: Oral  Oral   SpO2:  98%  97%  Weight:      Height:       Physical Exam Vitals and nursing note reviewed.  Constitutional:      General: He is not in acute distress. HENT:     Head: Normocephalic and atraumatic.  Cardiovascular:     Rate and Rhythm: Normal rate and regular rhythm.     Heart sounds: Normal heart sounds.  Pulmonary:     Effort: Pulmonary effort is normal.     Breath sounds: Normal breath sounds.  Abdominal:     Palpations: Abdomen is soft.     Tenderness: There is no abdominal tenderness.  Neurological:     Mental Status: Mental status is at baseline.     Labs on Admission: I have personally reviewed following labs and imaging studies  CBC: Recent Labs  Lab 10/14/23 2011  WBC 6.7  NEUTROABS 3.0  HGB 15.3  HCT 42.8  MCV 95.3  PLT 176   Basic Metabolic Panel: Recent Labs  Lab 10/14/23 2011  NA 141  K 3.4*  CL 103  CO2 27  GLUCOSE 103*  BUN 20  CREATININE 1.18  CALCIUM 9.0   GFR: Estimated Creatinine Clearance: 61.9 mL/min (by C-G formula based on SCr of 1.18 mg/dL). Liver Function Tests: Recent Labs  Lab 10/14/23 2011  AST 21  ALT 19  ALKPHOS 43  BILITOT 0.6  PROT 6.8  ALBUMIN 4.2   No results for input(s): "LIPASE", "AMYLASE" in the last 168 hours. No results for input(s): "AMMONIA" in the last 168 hours. Coagulation Profile: No results for input(s): "INR", "PROTIME" in  the last 168 hours. Cardiac Enzymes: No results for input(s): "CKTOTAL", "CKMB", "CKMBINDEX", "TROPONINI" in the last 168 hours. BNP (last 3 results) No results for input(s): "PROBNP" in the last 8760 hours. HbA1C: No results for input(s): "HGBA1C" in the last 72 hours. CBG: No results for input(s): "GLUCAP" in the last 168 hours. Lipid Profile: No results for input(s): "CHOL", "HDL", "LDLCALC", "TRIG", "CHOLHDL", "LDLDIRECT"  in the last 72 hours. Thyroid Function Tests: No results for input(s): "TSH", "T4TOTAL", "FREET4", "T3FREE", "THYROIDAB" in the last 72 hours. Anemia Panel: No results for input(s): "VITAMINB12", "FOLATE", "FERRITIN", "TIBC", "IRON", "RETICCTPCT" in the last 72 hours. Urine analysis:    Component Value Date/Time   COLORURINE YELLOW (A) 10/14/2023 2011   APPEARANCEUR CLEAR (A) 10/14/2023 2011   APPEARANCEUR Clear 12/07/2020 0946   LABSPEC 1.019 10/14/2023 2011   PHURINE 6.0 10/14/2023 2011   GLUCOSEU NEGATIVE 10/14/2023 2011   HGBUR NEGATIVE 10/14/2023 2011   BILIRUBINUR NEGATIVE 10/14/2023 2011   BILIRUBINUR Negative 12/07/2020 0946   KETONESUR NEGATIVE 10/14/2023 2011   PROTEINUR NEGATIVE 10/14/2023 2011   NITRITE NEGATIVE 10/14/2023 2011   LEUKOCYTESUR NEGATIVE 10/14/2023 2011    Radiological Exams on Admission: CT Head Wo Contrast Result Date: 10/15/2023 CLINICAL DATA:  TIA.  Left arm tingling EXAM: CT HEAD WITHOUT CONTRAST TECHNIQUE: Contiguous axial images were obtained from the base of the skull through the vertex without intravenous contrast. RADIATION DOSE REDUCTION: This exam was performed according to the departmental dose-optimization program which includes automated exposure control, adjustment of the mA and/or kV according to patient size and/or use of iterative reconstruction technique. COMPARISON:  None Available. FINDINGS: Brain: No acute intracranial abnormality. Specifically, no hemorrhage, hydrocephalus, mass lesion, acute infarction, or  significant intracranial injury. Vascular: No hyperdense vessel or unexpected calcification. Skull: No acute calvarial abnormality. Sinuses/Orbits: No acute findings Other: None IMPRESSION: No acute intracranial abnormality. Electronically Signed   By: Charlett Nose M.D.   On: 10/15/2023 01:16   DG Chest Portable 1 View Result Date: 10/14/2023 CLINICAL DATA:  Left arm tingling.  Hypertension EXAM: PORTABLE CHEST 1 VIEW COMPARISON:  08/29/2011 FINDINGS: The heart size and mediastinal contours are within normal limits. Both lungs are clear. The visualized skeletal structures are unremarkable. IMPRESSION: No active disease. Electronically Signed   By: Charlett Nose M.D.   On: 10/14/2023 20:25     Data Reviewed: Relevant notes from primary care and specialist visits, past discharge summaries as available in EHR, including Care Everywhere. Prior diagnostic testing as pertinent to current admission diagnoses Updated medications and problem lists for reconciliation ED course, including vitals, labs, imaging, treatment and response to treatment Triage notes, nursing and pharmacy notes and ED provider's notes Notable results as noted in HPI   Assessment and Plan: * Atypical chest pain History of elevated coronary calcium score-292, 04/2023 Family history of CAD Hypertensive urgency Pain seems atypical but patient has family history of known premature CAD and multiple first-degree relatives and coronary calcium score of 292 Troponin slightly elevated but flat Chest pain possibly related to hypertensive urgency Monitor overnight Continue aspirin Cardiology consult to advise on need for further risk stratification  Numbness and tingling left arm with complete resolution Several hours of tingling left arm now resolved.  Question anginal equivalent versus TIA Head CT negative Continuous cardiac monitoring for arrhythmias, echocardiogram to evaluate for thromboembolic phenomenon Neuro checks q4 hrs x  24 hrs and then per shift Will allow permissive hypertension  Sinus bradycardia Heart rate in the 50s Continue to monitor     DVT prophylaxis: Lovenox  Consults: [facility] cardiology  Advance Care Planning: full code  Family Communication: Wife at bedside  Disposition Plan: Back to previous home environment  Severity of Illness: The appropriate patient status for this patient is OBSERVATION. Observation status is judged to be reasonable and necessary in order to provide the required intensity of service to ensure the patient's safety. The  patient's presenting symptoms, physical exam findings, and initial radiographic and laboratory data in the context of their medical condition is felt to place them at decreased risk for further clinical deterioration. Furthermore, it is anticipated that the patient will be medically stable for discharge from the hospital within 2 midnights of admission.   Author: Andris Baumann, MD 10/15/2023 2:09 AM  For on call review [email or web address].

## 2023-10-15 NOTE — Progress Notes (Signed)
OT Screen Note  Patient Details Name: Raymond Flowers MRN: [id number] DOB: [birth year]   Cancelled Treatment:    Reason Eval/Treat Not Completed: OT screened, no needs identified, will sign off. Spoke with PT. Pt indep at this time, no skilled OT needs. Pt in agreement. Will sign off.   Arman Filter., MPH, MS, OTR/L ascom [phone] 10/15/23, 10:09 AM

## 2023-10-15 NOTE — ED Notes (Signed)
Assumed patient care at approximately 0720 and received report from the previous nurse.

## 2023-10-15 NOTE — Assessment & Plan Note (Addendum)
Family history of premature CAD

## 2023-10-15 NOTE — ED Notes (Signed)
Pt returned from stress test

## 2023-10-15 NOTE — Consult Note (Addendum)
Cardiology Consultation   Patient ID: Raymond Flowers MRN: [id number]; DOB: 16-Nov-1955  Admit date: 10/14/2023 Date of Consult: 10/15/2023  PCP:  Danella Penton, MD   [facility] Providers Cardiologist:  New   Patient Profile:   Raymond Flowers is a 68 y.o. male with a hx of coronary calcium, HTN, HLD, family history of cardiac isues who is being seen 10/15/2023 for the evaluation of elevated troponin at the request of Dr. Ashok Pall.  History of Present Illness:   [name] has not seen cardiology int he past. Has cardiac issues in both sides of is family. Dad had bypass at 39 and mother had stents later in her life. No significant alcohol use. No drug or smoking history. Patient is overall very active and lives a healthy lifestyle. He takes a statin and hydrochlorothiazide.  In 04/2023 coronary calcium score was 292, 66th percentile for age and sex matched. Recommended ASA and statin.   The patient came to the ER for left arm discomfort. He went hunting and was driving home when he started feeling a strange sensation in his left arm. It was constant and not worse with exertion. If felt as if his arm was fatigued. He had no chest pain, SOB, LLE, nausea, vomiting, lightheadedness or dizziness. He took a baby ASA and ultimately came into the ER when arm discomfort continued.  In the ER BP 175/79, pulse 67bpm, afebrile, RR 18. Labs showed potassium 3.4, BG 103. HS trop 23>24. CXR non acute. CT head negative. EKG showed NSR 64bpm, nonspecific T wave changes. The patient was admitted for further work-up.   Past Medical History:  Diagnosis Date   Androgen deficiency    Arthritis    shoulders, toe   Benign enlargement of prostate    Blood in semen    BPH (benign prostatic hyperplasia)    Erectile dysfunction    History of kidney stones    Hypertension    Impotence    Skin cancer, basal cell    Resected from Right facial area.     Past Surgical History:   Procedure Laterality Date   KNEE ARTHROSCOPY Right 2016   REVERSE SHOULDER ARTHROPLASTY Right 02/02/2021   Procedure: REVERSE SHOULDER ARTHROPLASTY;  Surgeon: Francena Hanly, MD;  Location: [facility];  Service: Orthopedics;  Laterality: Right;    SHOULDER SURGERY  2009   right  shoulder x 2  (20016)   TONSILLECTOMY     tricep surgery Left 2015   VARICOCELE EXCISION       Home Medications:  Prior to Admission medications   Medication Sig Start Date End Date Taking? Authorizing Provider  aspirin 81 MG tablet Take 81 mg by mouth daily.   Yes [provider]  hydrochlorothiazide (HYDRODIURIL) 25 MG tablet Take 25 mg by mouth daily.  03/02/15  Yes [provider]  Multiple Vitamins-Minerals (OCUVITE ADULT 50+ PO) Take 1 tablet by mouth daily.   Yes [provider]  rosuvastatin (CRESTOR) 10 MG tablet Take 10 mg by mouth at bedtime.   Yes [provider]  sildenafil (VIAGRA) 100 MG tablet TAKE 1 TABLET BY MOUTH DAILY AS NEEDED FOR ERECTILE DYSFUNCTION - TAKE 2 HOURS PRIOR TO INTERCOURSE ON AN EMPTY STOMACH. 12/24/22  Yes McGowan, Carollee Herter A, PA-C  tadalafil (CIALIS) 5 MG tablet Take 1 tablet (5 mg total) by mouth daily as needed for erectile dysfunction. 12/24/22  Yes McGowan, Carollee Herter A, PA-C  vitamin B-12 (CYANOCOBALAMIN) 1000 MCG  tablet Take 1,000 mcg by mouth daily.   Yes [provider]    Inpatient Medications: Scheduled Meds:  [START ON 10/16/2023]  stroke: early stages of recovery book   Does not apply Once   [START ON 10/16/2023] aspirin EC  81 mg Oral Daily   enoxaparin (LOVENOX) injection  40 mg Subcutaneous Q24H   Continuous Infusions:  PRN Meds: acetaminophen, nitroGLYCERIN, ondansetron (ZOFRAN) IV  Allergies:    Allergies  Allergen Reactions   Codeine Other (See Comments)    Reaction:  Hallucinations    Oxycodone     Other reaction(s): Hallucination    Social History:   Social History   Socioeconomic History   Marital  status: Married    Spouse name: Not on file   Number of children: Not on file   Years of education: Not on file   Highest education level: Not on file  Occupational History   Not on file  Tobacco Use   Smoking status: Never   Smokeless tobacco: Never  Vaping Use   Vaping status: Never Used  Substance and Sexual Activity   Alcohol use: Yes    Alcohol/week: 2.0 standard drinks of alcohol    Types: 1 Glasses of wine, 1 Standard drinks or equivalent per week    Comment: socially   Drug use: No   Sexual activity: Yes  Other Topics Concern   Not on file  Social History Narrative   Not on file   Social Drivers of Health   Financial Resource Strain: Low Risk  (04/24/2023)   Received from [facility]   Overall Financial Resource Strain (CARDIA)    Difficulty of Paying Living Expenses: Not hard at all  Food Insecurity: No Food Insecurity (04/24/2023)   Received from [facility]   Hunger Vital Sign    Worried About Running Out of Food in the Last Year: Never true    Ran Out of Food in the Last Year: Never true  Transportation Needs: No Transportation Needs (04/24/2023)   Received from [facility] - Transportation    In the past 12 months, has lack of transportation kept you from medical appointments or from getting medications?: No    Lack of Transportation (Non-Medical): No  Physical Activity: Not on file  Stress: Not on file  Social Connections: Not on file  Intimate Partner Violence: Not on file    Family History:    Family History  Problem Relation Age of Onset   Heart disease Mother 17   Hypertension Mother    Heart disease Father 61       MI    Kidney Stones Father        mother   Hypertension Brother    Prostate cancer Neg Hx    Kidney disease Neg Hx    Kidney cancer Neg Hx    Bladder Cancer Neg Hx      ROS:  Please see the history of present illness.   All other ROS reviewed and negative.      Physical Exam/Data:   Vitals:   10/15/23 0200 10/15/23 0317 10/15/23 0647 10/15/23 0800  BP: 139/72 131/71 103/68 124/61  Pulse: (!) 56 (!) 54 65 (!) 52  Resp: 18 19 (!) 24 16  Temp:  97.8 F (36.6 C) 97.6 F (36.4 C)   TempSrc:  Oral Oral   SpO2: 98% 94% 95% 95%  Weight:      Height:  No intake or output data in the 24 hours ending 10/15/23 0854    10/14/2023    8:04 PM 12/19/2021    2:27 PM 10/23/2021    9:46 AM  Last 3 Weights  Weight (lbs) 190 lb 195 lb 203 lb  Weight (kg) 86.183 kg 88.451 kg 92.08 kg     Body mass index is 27.26 kg/m.  General:  Well nourished, well developed, in no acute distress HEENT: normal Neck: no JVD Vascular: No carotid bruits; Distal pulses 2+ bilaterally Cardiac:  normal S1, S2; RRR; no murmur  Lungs:  clear to auscultation bilaterally, no wheezing, rhonchi or rales  Abd: soft, nontender, no hepatomegaly  Ext: no edema Musculoskeletal:  No deformities, BUE and BLE strength normal and equal Skin: warm and dry  Neuro:  CNs 2-12 intact, no focal abnormalities noted Psych:  Normal affect   EKG:  The EKG was personally reviewed and demonstrates:  NSR 64, nonspecific T wave changes Telemetry:  Telemetry was personally reviewed and demonstrates:  SB HR 50s  Relevant CV Studies:    IMPRESSION AND RECOMMENDATION: 1. Coronary calcium score of 292. This was 66th percentile for age and sex matched control.   2. CAC 100-299 in LAD, LCx, RCA   CAC-DRS A2/N3.   3. Recommend aspirin and statin if no contraindication.   4. Continue heart healthy lifestyle and risk factor modification.    Laboratory Data:  High Sensitivity Troponin:   Recent Labs  Lab 10/14/23 2011 10/15/23 0015  TROPONINIHS 23* 24*     Chemistry Recent Labs  Lab 10/14/23 2011  NA 141  K 3.4*  CL 103  CO2 27  GLUCOSE 103*  BUN 20  CREATININE 1.18  CALCIUM 9.0  GFRNONAA >60  ANIONGAP 11    Recent Labs  Lab 10/14/23 2011  PROT 6.8  ALBUMIN 4.2   AST 21  ALT 19  ALKPHOS 43  BILITOT 0.6   Lipids  Recent Labs  Lab 10/15/23 0248  CHOL 98  TRIG 50  HDL 35*  LDLCALC 53  CHOLHDL 2.8    Hematology Recent Labs  Lab 10/14/23 2011 10/15/23 0248  WBC 6.7 6.4  RBC 4.49 4.36  HGB 15.3 14.7  HCT 42.8 41.2  MCV 95.3 94.5  MCH 34.1* 33.7  MCHC 35.7 35.7  RDW 13.0 12.9  PLT 176 160   Thyroid No results for input(s): "TSH", "FREET4" in the last 168 hours.  BNPNo results for input(s): "BNP", "PROBNP" in the last 168 hours.  DDimer No results for input(s): "DDIMER" in the last 168 hours.   Radiology/Studies:  CT Head Wo Contrast Result Date: 10/15/2023 CLINICAL DATA:  TIA.  Left arm tingling EXAM: CT HEAD WITHOUT CONTRAST TECHNIQUE: Contiguous axial images were obtained from the base of the skull through the vertex without intravenous contrast. RADIATION DOSE REDUCTION: This exam was performed according to the departmental dose-optimization program which includes automated exposure control, adjustment of the mA and/or kV according to patient size and/or use of iterative reconstruction technique. COMPARISON:  None Available. FINDINGS: Brain: No acute intracranial abnormality. Specifically, no hemorrhage, hydrocephalus, mass lesion, acute infarction, or significant intracranial injury. Vascular: No hyperdense vessel or unexpected calcification. Skull: No acute calvarial abnormality. Sinuses/Orbits: No acute findings Other: None IMPRESSION: No acute intracranial abnormality. Electronically Signed   By: Charlett Nose M.D.   On: 10/15/2023 01:16   DG Chest Portable 1 View Result Date: 10/14/2023 CLINICAL DATA:  Left arm tingling.  Hypertension EXAM: PORTABLE CHEST 1 VIEW COMPARISON:  08/29/2011 FINDINGS: The heart size and mediastinal contours are within normal limits. Both lungs are clear. The visualized skeletal structures are unremarkable. IMPRESSION: No active disease. Electronically Signed   By: Charlett Nose M.D.   On: 10/14/2023 20:25      Assessment and Plan:   Left arm discomfort/atypical chest pain - patient presented with left arm discomfort that was constant found to have minimally elevated troponins and elevated blood pressure. Head CT negative. - HS trop 23>24 -  EKG nonischemic - coronary calcium score in 2024 was 292, 66th percentile for age and sex matched control - RF include family history, HLD, HTN - continue ASA, statin - NO BB given Bradycardia - echo has been ordered - plan for treadmill myoview today  HLD - LDL 53 - continue Crestor 10mg  daily  HTN - PTA hydrochlorothiazide 25mg  daily - BP elevated on arrival, but now normalized  For questions or updates, please contact [facility] Please consult [email or web address] for contact info under    Signed, Kayline Sheer David Stall, PA-C  10/15/2023 8:54 AM

## 2023-10-15 NOTE — ED Notes (Signed)
Pt verbalizes understanding of discharge instructions. Opportunity for questioning and answers were provided. Pt discharged from ED to home with family.    

## 2023-10-15 NOTE — Progress Notes (Signed)
*  PRELIMINARY RESULTS* Echocardiogram 2D Echocardiogram has been performed.  Raymond Flowers 10/15/2023, 12:27 PM

## 2023-10-15 NOTE — Progress Notes (Signed)
PT Cancellation Note  Patient Details Name: Raymond Flowers MRN: [id number] DOB: [birth year]   Cancelled Treatment:    Reason Eval/Treat Not Completed: PT screened, no needs identified, will sign off. PT spoke with pt, pt has been ambulating to/from the bathroom, unconcerned about his balance. L shoulder MMT 5/5 and pt denied any sensation changes. No further acute PT needs indicated. PT to sign off. Please reconsult PT if pt status changes or acute needs are identified.    Olga Coaster PT, DPT 10:08 AM,10/15/23

## 2023-10-16 LAB — LIPOPROTEIN A (LPA): Lipoprotein (a): 20.7 nmol/L (ref <75.0)

## 2023-10-23 DIAGNOSIS — R0789 Other chest pain: Secondary | ICD-10-CM | POA: Diagnosis not present

## 2023-10-23 DIAGNOSIS — M5090 Cervical disc disorder, unspecified, unspecified cervical region: Secondary | ICD-10-CM | POA: Diagnosis not present

## 2023-11-21 ENCOUNTER — Ambulatory Visit: Payer: Medicare HMO | Admitting: Nurse Practitioner

## 2023-12-19 ENCOUNTER — Other Ambulatory Visit: Payer: Medicare HMO

## 2023-12-19 ENCOUNTER — Other Ambulatory Visit: Payer: Self-pay

## 2023-12-19 DIAGNOSIS — N401 Enlarged prostate with lower urinary tract symptoms: Secondary | ICD-10-CM

## 2023-12-19 DIAGNOSIS — N529 Male erectile dysfunction, unspecified: Secondary | ICD-10-CM

## 2023-12-19 DIAGNOSIS — N138 Other obstructive and reflux uropathy: Secondary | ICD-10-CM | POA: Diagnosis not present

## 2023-12-20 LAB — TESTOSTERONE: Testosterone: 547 ng/dL (ref 264–916)

## 2023-12-20 LAB — PSA: Prostate Specific Ag, Serum: 1.2 ng/mL (ref 0.0–4.0)

## 2023-12-22 NOTE — Progress Notes (Unsigned)
 12/24/23 8:49 AM   Raymond Flowers March 02, 1956 [id number]  Referring provider:  Danella Penton, MD [phone] [facility] [address] [facility] [place],  [place] [address]  Urological history  1. Hypogonadism - Contributing factors of age - Testosterone (11/2023) 547 - hemoglobin/hematocrit (11/2023) 14.7/41.2  2. ED - contributing factors of age, BPH, HLD and pre-diabetes - managed with tadalafil 5 mg daily and to supplement with the sildenafil 100 mg on days for intercourse  3. BPH with LU TS - PSA (11/2023) 1.2 - prostate volume 50 cc   4. High risk hematuria - Non-smoker - CTU 05/2020 Bilateral nonobstructive nephrolithiasis.   Several benign-appearing left renal cysts.  Mild prostatomegaly.   - Cysto 05/2020 NED  Follow-up   HPI: Raymond Flowers is a 68 y.o.male who presents today for a 1 year follow-up.  Previous records reviewed.     I PSS 3/1  He has nocturia x 3, but it is not bothersome to him.  He states he he drinks a lot of fluid to stay well-hydrated.   He is taking tadalafil 5 mg daily.     IPSS     Row Name 12/24/23 0800         International Prostate Symptom Score   How often have you had the sensation of not emptying your bladder? Not at All     How often have you had to urinate less than every two hours? Not at All     How often have you found you stopped and started again several times when you urinated? Not at All     How often have you found it difficult to postpone urination? Not at All     How often have you had a weak urinary stream? Not at All     How often have you had to strain to start urination? Not at All     How many times did you typically get up at night to urinate? 3 Times     Total IPSS Score 3       Quality of Life due to urinary symptoms   If you were to spend the rest of your life with your urinary condition just the way it is now how would you feel about that? Pleased               Score:   1-7 Mild 8-19 Moderate 20-35 Severe  SHIM 21  He is taking sildenafil 100 mg on-demand dosing.  He is having satisfactory erections with this.   SHIM     Row Name 12/24/23 0827         SHIM: Over the last 6 months:   How do you rate your confidence that you could get and keep an erection? Moderate     When you had erections with sexual stimulation, how often were your erections hard enough for penetration (entering your partner)? Almost Always or Always     During sexual intercourse, how often were you able to maintain your erection after you had penetrated (entered) your partner? Most Times (much more than half the time)     During sexual intercourse, how difficult was it to maintain your erection to completion of intercourse? Slightly Difficult     When you attempted sexual intercourse, how often was it satisfactory for you? Almost Always or Always       SHIM Total Score   SHIM 21  Score: 1-7 Severe ED 8-11 Moderate ED 12-16 Mild-Moderate ED 17-21 Mild ED 22-25 No ED   PMH: Past Medical History:  Diagnosis Date   Androgen deficiency    Arthritis    shoulders, toe   Benign enlargement of prostate    Blood in semen    BPH (benign prostatic hyperplasia)    Erectile dysfunction    History of kidney stones    Hypertension    Impotence    Skin cancer, basal cell    Resected from Right facial area.     Surgical History: Past Surgical History:  Procedure Laterality Date   KNEE ARTHROSCOPY Right 2016   REVERSE SHOULDER ARTHROPLASTY Right 02/02/2021   Procedure: REVERSE SHOULDER ARTHROPLASTY;  Surgeon: Francena Hanly, MD;  Location: [facility];  Service: Orthopedics;  Laterality: Right;    SHOULDER SURGERY  2009   right  shoulder x 2  (20016)   TONSILLECTOMY     tricep surgery Left 2015   VARICOCELE EXCISION      Home Medications:  Allergies as of 12/24/2023       Reactions   Codeine Other (See Comments)   Reaction:  Hallucinations    Oxycodone    Other reaction(s): Hallucination        Medication List        Accurate as of December 24, 2023  8:49 AM. If you have any questions, ask your nurse or doctor.          STOP taking these medications    OCUVITE ADULT 50+ PO Stopped by: Michiel Cowboy       TAKE these medications    aspirin 81 MG tablet Take 81 mg by mouth daily.   cyanocobalamin 1000 MCG tablet Commonly known as: VITAMIN B12 Take 1,000 mcg by mouth daily.   hydrochlorothiazide 25 MG tablet Commonly known as: HYDRODIURIL Take 25 mg by mouth daily.   rosuvastatin 10 MG tablet Commonly known as: CRESTOR Take 10 mg by mouth at bedtime.   sildenafil 100 MG tablet Commonly known as: VIAGRA Take 1 tablet (100 mg total) by mouth daily as needed for erectile dysfunction.   tadalafil 5 MG tablet Commonly known as: CIALIS Take 1 tablet (5 mg total) by mouth daily as needed for erectile dysfunction.        Allergies:  Allergies  Allergen Reactions   Codeine Other (See Comments)    Reaction:  Hallucinations    Oxycodone     Other reaction(s): Hallucination    Family History: Family History  Problem Relation Age of Onset   Heart disease Mother 31   Hypertension Mother    Heart disease Father 32       MI    Kidney Stones Father        mother   Hypertension Brother    Prostate cancer Neg Hx    Kidney disease Neg Hx    Kidney cancer Neg Hx    Bladder Cancer Neg Hx     Social History:  reports that he has never smoked. He has never used smokeless tobacco. He reports current alcohol use of about 2.0 standard drinks of alcohol per week. He reports that he does not use drugs.   Physical Exam: BP 132/73   Pulse (!) 56   Ht 5' 9.5" (1.765 m)   Wt 192 lb 6.4 oz (87.3 kg)   BMI 28.01 kg/m   Constitutional:  Well nourished. Alert and oriented, No acute distress. HEENT:  AT, moist mucus membranes.  Trachea midline Cardiovascular: No clubbing, cyanosis, or edema. Respiratory:  Normal respiratory effort, no increased work of breathing. GU: No CVA tenderness.  No bladder fullness or masses.  Patient with circumcised phallus.  Urethral meatus is patent.  No penile discharge. No penile lesions or rashes. Scrotum without lesions, cysts, rashes and/or edema.  Testicles are located scrotally bilaterally. No masses are appreciated in the testicles. Left and right epididymis are normal. Rectal: Patient with  normal sphincter tone. Anus and perineum without scarring or rashes. No rectal masses are appreciated. Prostate is approximately 50 grams, no nodules are appreciated. Seminal vesicles could not be palpated.  Neurologic: Grossly intact, no focal deficits, moving all 4 extremities. Psychiatric: Normal mood and affect.   Laboratory Data: Results for orders placed or performed in visit on 12/19/23  Testosterone   Collection Time: 12/19/23  8:12 AM  Result Value Ref Range   Testosterone 547 264 - 916 ng/dL  PSA   Collection Time: 12/19/23  8:12 AM  Result Value Ref Range   Prostate Specific Ag, Serum 1.2 0.0 - 4.0 ng/mL    CBC    Component Value Date/Time   WBC 6.4 10/15/2023 0248   RBC 4.36 10/15/2023 0248   HGB 14.7 10/15/2023 0248   HGB 15.9 12/18/2022 0805   HCT 41.2 10/15/2023 0248   HCT 46.9 12/18/2022 0805   PLT 160 10/15/2023 0248   MCV 94.5 10/15/2023 0248   MCH 33.7 10/15/2023 0248   MCHC 35.7 10/15/2023 0248   RDW 12.9 10/15/2023 0248   LYMPHSABS 2.8 10/14/2023 2011   MONOABS 0.8 10/14/2023 2011   EOSABS 0.2 10/14/2023 2011   BASOSABS 0.1 10/14/2023 2011    Hemoglobin A1c Order: [id number]  Status: Final result     Next appt: 12/24/2023 at 08:20 AM in Urology [facility], PA-C)   Test Result Released: Yes (seen)   0 Result Notes    Component Ref Range & Units (hover) 2 mo ago  Hgb A1c MFr Bld 5.4  Comment: (NOTE) Pre diabetes:          5.7%-6.4%  Diabetes:              >6.4%  Glycemic control for   <7.0% adults with diabetes  Mean  Plasma Glucose 108.28  Comment: Performed at [facility], [address] [place], [place] [address]  Resulting Agency [facility]         Component     Latest Ref Rng 10/14/2023  Specific Gravity, UA     1.005 - 1.030    pH, UA     5.0 - 7.5    Color, UA     Yellow    Appearance Ur     Clear    Leukocytes,UA     Negative    Protein,UA     Negative/Trace    Glucose, UA     NEGATIVE mg/dL NEGATIVE   Ketones, UA     Negative    RBC, UA     Negative    Bilirubin, UA     Negative    Urobilinogen, Ur     0.2 - 1.0 mg/dL   Nitrite, UA     Negative    Microscopic Examination   Color, Urine     YELLOW  YELLOW !   Appearance     CLEAR  CLEAR !   Specific Gravity, Urine     1.005 - 1.030  1.019   pH  5.0 - 8.0  6.0   Hgb urine dipstick     NEGATIVE  NEGATIVE   Bilirubin Urine     NEGATIVE  NEGATIVE   Ketones, ur     NEGATIVE mg/dL NEGATIVE   Protein     NEGATIVE mg/dL NEGATIVE   Nitrite     NEGATIVE  NEGATIVE   Leukocytes,Ua     NEGATIVE  NEGATIVE     Legend: ! Abnormal I have reviewed the labs.   Assessment & Plan:    1. Erectile dysfunction - continue sildenafil 100 mg, on-demand-dosing  2. BPH with LU TS - PSA; stable  - DRE; benign  -Continue tadalafil 5 mg daily - Will continue to screen in 1 year   Return in about 1 year (around 12/23/2024) for PSA, testosterone, I PSS, SHIM and exam .   Michiel Cowboy, PA-C   [facility] [address], [address] [place], [place] [address] [phone]

## 2023-12-24 ENCOUNTER — Other Ambulatory Visit: Payer: Self-pay

## 2023-12-24 ENCOUNTER — Encounter: Payer: Self-pay | Admitting: Urology

## 2023-12-24 ENCOUNTER — Ambulatory Visit (INDEPENDENT_AMBULATORY_CARE_PROVIDER_SITE_OTHER): Payer: Self-pay | Admitting: Urology

## 2023-12-24 VITALS — BP 132/73 | HR 56 | Ht 69.5 in | Wt 192.4 lb

## 2023-12-24 DIAGNOSIS — N529 Male erectile dysfunction, unspecified: Secondary | ICD-10-CM

## 2023-12-24 DIAGNOSIS — N401 Enlarged prostate with lower urinary tract symptoms: Secondary | ICD-10-CM | POA: Diagnosis not present

## 2023-12-24 DIAGNOSIS — N138 Other obstructive and reflux uropathy: Secondary | ICD-10-CM

## 2023-12-24 MED ORDER — SILDENAFIL CITRATE 100 MG PO TABS: 100.0000 mg | ORAL_TABLET | Freq: Every day | ORAL | 11 refills | Status: AC | PRN

## 2023-12-24 MED ORDER — TADALAFIL 5 MG PO TABS: 5.0000 mg | ORAL_TABLET | Freq: Every day | ORAL | 3 refills | Status: AC | PRN

## 2024-01-28 DIAGNOSIS — W57XXXA Bitten or stung by nonvenomous insect and other nonvenomous arthropods, initial encounter: Secondary | ICD-10-CM | POA: Diagnosis not present

## 2024-01-28 DIAGNOSIS — S40862A Insect bite (nonvenomous) of left upper arm, initial encounter: Secondary | ICD-10-CM | POA: Diagnosis not present

## 2024-03-31 DIAGNOSIS — H40003 Preglaucoma, unspecified, bilateral: Secondary | ICD-10-CM | POA: Diagnosis not present

## 2024-04-09 DIAGNOSIS — H2513 Age-related nuclear cataract, bilateral: Secondary | ICD-10-CM | POA: Diagnosis not present

## 2024-04-09 DIAGNOSIS — H04123 Dry eye syndrome of bilateral lacrimal glands: Secondary | ICD-10-CM | POA: Diagnosis not present

## 2024-04-09 DIAGNOSIS — H40003 Preglaucoma, unspecified, bilateral: Secondary | ICD-10-CM | POA: Diagnosis not present

## 2024-04-20 DIAGNOSIS — Z872 Personal history of diseases of the skin and subcutaneous tissue: Secondary | ICD-10-CM | POA: Diagnosis not present

## 2024-04-20 DIAGNOSIS — L570 Actinic keratosis: Secondary | ICD-10-CM | POA: Diagnosis not present

## 2024-04-20 DIAGNOSIS — G548 Other nerve root and plexus disorders: Secondary | ICD-10-CM | POA: Diagnosis not present

## 2024-04-20 DIAGNOSIS — Z125 Encounter for screening for malignant neoplasm of prostate: Secondary | ICD-10-CM | POA: Diagnosis not present

## 2024-04-20 DIAGNOSIS — R7989 Other specified abnormal findings of blood chemistry: Secondary | ICD-10-CM | POA: Diagnosis not present

## 2024-04-20 DIAGNOSIS — L578 Other skin changes due to chronic exposure to nonionizing radiation: Secondary | ICD-10-CM | POA: Diagnosis not present

## 2024-04-20 DIAGNOSIS — Z859 Personal history of malignant neoplasm, unspecified: Secondary | ICD-10-CM | POA: Diagnosis not present

## 2024-04-20 DIAGNOSIS — E291 Testicular hypofunction: Secondary | ICD-10-CM | POA: Diagnosis not present

## 2024-04-20 DIAGNOSIS — Z79899 Other long term (current) drug therapy: Secondary | ICD-10-CM | POA: Diagnosis not present

## 2024-04-20 DIAGNOSIS — Z85820 Personal history of malignant melanoma of skin: Secondary | ICD-10-CM | POA: Diagnosis not present

## 2024-04-20 DIAGNOSIS — E538 Deficiency of other specified B group vitamins: Secondary | ICD-10-CM | POA: Diagnosis not present

## 2024-04-20 DIAGNOSIS — L821 Other seborrheic keratosis: Secondary | ICD-10-CM | POA: Diagnosis not present

## 2024-04-28 DIAGNOSIS — Z0000 Encounter for general adult medical examination without abnormal findings: Secondary | ICD-10-CM | POA: Diagnosis not present

## 2024-04-28 DIAGNOSIS — E785 Hyperlipidemia, unspecified: Secondary | ICD-10-CM | POA: Diagnosis not present

## 2024-04-28 DIAGNOSIS — Z1331 Encounter for screening for depression: Secondary | ICD-10-CM | POA: Diagnosis not present

## 2024-04-28 DIAGNOSIS — E538 Deficiency of other specified B group vitamins: Secondary | ICD-10-CM | POA: Diagnosis not present

## 2024-04-28 DIAGNOSIS — Z125 Encounter for screening for malignant neoplasm of prostate: Secondary | ICD-10-CM | POA: Diagnosis not present

## 2024-04-29 DIAGNOSIS — S46312D Strain of muscle, fascia and tendon of triceps, left arm, subsequent encounter: Secondary | ICD-10-CM | POA: Diagnosis not present

## 2024-04-29 DIAGNOSIS — S5002XA Contusion of left elbow, initial encounter: Secondary | ICD-10-CM | POA: Diagnosis not present

## 2024-06-22 DIAGNOSIS — M25522 Pain in left elbow: Secondary | ICD-10-CM | POA: Diagnosis not present

## 2024-09-01 DIAGNOSIS — L249 Irritant contact dermatitis, unspecified cause: Secondary | ICD-10-CM | POA: Diagnosis not present

## 2024-12-16 ENCOUNTER — Other Ambulatory Visit

## 2024-12-23 ENCOUNTER — Ambulatory Visit: Admitting: Urology
